# Patient Record
Sex: Male | Born: 1950 | Race: White | Hispanic: No | Marital: Single | Smoking: Former smoker
Health system: Southern US, Community
[De-identification: ages and names within clinical notes are randomized; demographics above are authoritative.]

## PROBLEM LIST (undated history)

## (undated) DIAGNOSIS — B20 Human immunodeficiency virus [HIV] disease: Secondary | ICD-10-CM

## (undated) DIAGNOSIS — I10 Essential (primary) hypertension: Secondary | ICD-10-CM

## (undated) DIAGNOSIS — Z21 Asymptomatic human immunodeficiency virus [HIV] infection status: Secondary | ICD-10-CM

---

## 1999-08-07 ENCOUNTER — Emergency Department (HOSPITAL_COMMUNITY): Admission: EM | Admit: 1999-08-07 | Discharge: 1999-08-07 | Payer: Self-pay | Admitting: Emergency Medicine

## 1999-08-08 ENCOUNTER — Encounter: Payer: Self-pay | Admitting: Emergency Medicine

## 1999-08-08 ENCOUNTER — Emergency Department (HOSPITAL_COMMUNITY): Admission: EM | Admit: 1999-08-08 | Discharge: 1999-08-08 | Payer: Self-pay | Admitting: Emergency Medicine

## 1999-08-14 ENCOUNTER — Ambulatory Visit (HOSPITAL_COMMUNITY): Admission: RE | Admit: 1999-08-14 | Discharge: 1999-08-14 | Payer: Self-pay | Admitting: Internal Medicine

## 1999-09-12 ENCOUNTER — Encounter: Admission: RE | Admit: 1999-09-12 | Discharge: 1999-09-12 | Payer: Self-pay | Admitting: Internal Medicine

## 2000-05-07 ENCOUNTER — Ambulatory Visit (HOSPITAL_COMMUNITY): Admission: RE | Admit: 2000-05-07 | Discharge: 2000-05-07 | Payer: Self-pay | Admitting: Internal Medicine

## 2002-08-01 ENCOUNTER — Emergency Department (HOSPITAL_COMMUNITY): Admission: EM | Admit: 2002-08-01 | Discharge: 2002-08-01 | Payer: Self-pay | Admitting: Emergency Medicine

## 2002-08-01 ENCOUNTER — Encounter: Payer: Self-pay | Admitting: Emergency Medicine

## 2002-08-25 ENCOUNTER — Inpatient Hospital Stay (HOSPITAL_COMMUNITY): Admission: EM | Admit: 2002-08-25 | Discharge: 2002-08-28 | Payer: Self-pay | Admitting: Emergency Medicine

## 2002-08-25 ENCOUNTER — Encounter: Payer: Self-pay | Admitting: Emergency Medicine

## 2002-09-03 ENCOUNTER — Encounter: Admission: RE | Admit: 2002-09-03 | Discharge: 2002-09-03 | Payer: Self-pay | Admitting: Internal Medicine

## 2002-09-03 ENCOUNTER — Ambulatory Visit (HOSPITAL_COMMUNITY): Admission: RE | Admit: 2002-09-03 | Discharge: 2002-09-03 | Payer: Self-pay | Admitting: Internal Medicine

## 2002-10-20 ENCOUNTER — Encounter: Admission: RE | Admit: 2002-10-20 | Discharge: 2002-10-20 | Payer: Self-pay | Admitting: Internal Medicine

## 2003-03-06 ENCOUNTER — Emergency Department (HOSPITAL_COMMUNITY): Admission: EM | Admit: 2003-03-06 | Discharge: 2003-03-06 | Payer: Self-pay | Admitting: Emergency Medicine

## 2003-03-06 ENCOUNTER — Encounter: Payer: Self-pay | Admitting: Emergency Medicine

## 2003-03-16 ENCOUNTER — Encounter: Admission: RE | Admit: 2003-03-16 | Discharge: 2003-03-16 | Payer: Self-pay | Admitting: Internal Medicine

## 2003-03-16 ENCOUNTER — Encounter: Payer: Self-pay | Admitting: Internal Medicine

## 2004-01-20 ENCOUNTER — Ambulatory Visit (HOSPITAL_COMMUNITY): Admission: RE | Admit: 2004-01-20 | Discharge: 2004-01-20 | Payer: Self-pay | Admitting: Internal Medicine

## 2004-01-20 ENCOUNTER — Encounter: Admission: RE | Admit: 2004-01-20 | Discharge: 2004-01-20 | Payer: Self-pay | Admitting: Internal Medicine

## 2004-08-15 ENCOUNTER — Emergency Department (HOSPITAL_COMMUNITY): Admission: EM | Admit: 2004-08-15 | Discharge: 2004-08-16 | Payer: Self-pay | Admitting: Emergency Medicine

## 2005-08-19 ENCOUNTER — Emergency Department (HOSPITAL_COMMUNITY): Admission: EM | Admit: 2005-08-19 | Discharge: 2005-08-19 | Payer: Self-pay | Admitting: Emergency Medicine

## 2006-01-03 ENCOUNTER — Emergency Department (HOSPITAL_COMMUNITY): Admission: EM | Admit: 2006-01-03 | Discharge: 2006-01-03 | Payer: Self-pay | Admitting: Family Medicine

## 2006-01-06 ENCOUNTER — Emergency Department (HOSPITAL_COMMUNITY): Admission: AD | Admit: 2006-01-06 | Discharge: 2006-01-06 | Payer: Self-pay | Admitting: Family Medicine

## 2007-01-18 ENCOUNTER — Emergency Department (HOSPITAL_COMMUNITY): Admission: EM | Admit: 2007-01-18 | Discharge: 2007-01-18 | Payer: Self-pay | Admitting: Family Medicine

## 2007-03-28 ENCOUNTER — Encounter: Admission: RE | Admit: 2007-03-28 | Discharge: 2007-03-28 | Payer: Self-pay | Admitting: General Surgery

## 2007-04-03 ENCOUNTER — Ambulatory Visit (HOSPITAL_BASED_OUTPATIENT_CLINIC_OR_DEPARTMENT_OTHER): Admission: RE | Admit: 2007-04-03 | Discharge: 2007-04-03 | Payer: Self-pay | Admitting: General Surgery

## 2007-04-03 ENCOUNTER — Encounter (INDEPENDENT_AMBULATORY_CARE_PROVIDER_SITE_OTHER): Payer: Self-pay | Admitting: General Surgery

## 2009-05-01 ENCOUNTER — Emergency Department (HOSPITAL_COMMUNITY): Admission: EM | Admit: 2009-05-01 | Discharge: 2009-05-02 | Payer: Self-pay | Admitting: Emergency Medicine

## 2009-05-13 ENCOUNTER — Emergency Department (HOSPITAL_COMMUNITY): Admission: EM | Admit: 2009-05-13 | Discharge: 2009-05-13 | Payer: Self-pay | Admitting: Family Medicine

## 2009-06-03 ENCOUNTER — Ambulatory Visit (HOSPITAL_COMMUNITY): Admission: RE | Admit: 2009-06-03 | Discharge: 2009-06-03 | Payer: Self-pay | Admitting: Internal Medicine

## 2009-06-03 ENCOUNTER — Ambulatory Visit: Payer: Self-pay | Admitting: Internal Medicine

## 2009-06-03 DIAGNOSIS — F17200 Nicotine dependence, unspecified, uncomplicated: Secondary | ICD-10-CM

## 2009-06-03 DIAGNOSIS — I10 Essential (primary) hypertension: Secondary | ICD-10-CM

## 2009-06-03 DIAGNOSIS — Z87898 Personal history of other specified conditions: Secondary | ICD-10-CM

## 2009-06-03 DIAGNOSIS — Z9889 Other specified postprocedural states: Secondary | ICD-10-CM

## 2009-06-03 DIAGNOSIS — B20 Human immunodeficiency virus [HIV] disease: Secondary | ICD-10-CM

## 2009-06-03 DIAGNOSIS — Z8709 Personal history of other diseases of the respiratory system: Secondary | ICD-10-CM | POA: Insufficient documentation

## 2009-06-03 DIAGNOSIS — K029 Dental caries, unspecified: Secondary | ICD-10-CM | POA: Insufficient documentation

## 2009-06-03 DIAGNOSIS — J984 Other disorders of lung: Secondary | ICD-10-CM | POA: Insufficient documentation

## 2009-06-03 DIAGNOSIS — IMO0002 Reserved for concepts with insufficient information to code with codable children: Secondary | ICD-10-CM | POA: Insufficient documentation

## 2009-06-03 LAB — CONVERTED CEMR LAB
AST: 19 units/L (ref 0–37)
Albumin: 4 g/dL (ref 3.5–5.2)
Alkaline Phosphatase: 72 units/L (ref 39–117)
BUN: 15 mg/dL (ref 6–23)
Calcium: 9.5 mg/dL (ref 8.4–10.5)
Chloride: 104 meq/L (ref 96–112)
Cholesterol: 119 mg/dL (ref 0–200)
Creatinine, Ser: 0.86 mg/dL (ref 0.40–1.50)
GC Probe Amp, Urine: NEGATIVE
Glucose, Bld: 67 mg/dL — ABNORMAL LOW (ref 70–99)
HCV Ab: NEGATIVE
HIV 1 RNA Quant: 23200 copies/mL — ABNORMAL HIGH (ref <48)
HIV-1 RNA Quant, Log: 4.37 — ABNORMAL HIGH (ref <1.68)
Hep B S Ab: NEGATIVE
Triglycerides: 53 mg/dL (ref <150)
VLDL: 11 mg/dL (ref 0–40)

## 2009-06-07 ENCOUNTER — Telehealth (INDEPENDENT_AMBULATORY_CARE_PROVIDER_SITE_OTHER): Payer: Self-pay | Admitting: *Deleted

## 2009-07-08 ENCOUNTER — Ambulatory Visit: Payer: Self-pay | Admitting: Internal Medicine

## 2009-07-08 ENCOUNTER — Telehealth: Payer: Self-pay | Admitting: Internal Medicine

## 2009-07-08 DIAGNOSIS — H9319 Tinnitus, unspecified ear: Secondary | ICD-10-CM | POA: Insufficient documentation

## 2009-07-08 LAB — CONVERTED CEMR LAB

## 2009-10-25 ENCOUNTER — Ambulatory Visit: Payer: Self-pay | Admitting: Internal Medicine

## 2009-10-25 ENCOUNTER — Encounter (INDEPENDENT_AMBULATORY_CARE_PROVIDER_SITE_OTHER): Payer: Self-pay | Admitting: *Deleted

## 2009-11-10 ENCOUNTER — Telehealth: Payer: Self-pay | Admitting: Internal Medicine

## 2009-11-18 ENCOUNTER — Encounter (INDEPENDENT_AMBULATORY_CARE_PROVIDER_SITE_OTHER): Payer: Self-pay | Admitting: *Deleted

## 2009-11-18 ENCOUNTER — Emergency Department (HOSPITAL_COMMUNITY): Admission: EM | Admit: 2009-11-18 | Discharge: 2009-11-18 | Payer: Self-pay | Admitting: Family Medicine

## 2009-11-18 ENCOUNTER — Emergency Department (HOSPITAL_COMMUNITY): Admission: EM | Admit: 2009-11-18 | Discharge: 2009-11-19 | Payer: Self-pay | Admitting: Emergency Medicine

## 2009-11-19 ENCOUNTER — Encounter: Payer: Self-pay | Admitting: Internal Medicine

## 2009-11-19 ENCOUNTER — Encounter (INDEPENDENT_AMBULATORY_CARE_PROVIDER_SITE_OTHER): Payer: Self-pay | Admitting: Internal Medicine

## 2009-11-19 ENCOUNTER — Ambulatory Visit: Payer: Self-pay | Admitting: Internal Medicine

## 2009-11-19 ENCOUNTER — Inpatient Hospital Stay (HOSPITAL_COMMUNITY): Admission: EM | Admit: 2009-11-19 | Discharge: 2009-11-23 | Payer: Self-pay | Admitting: Emergency Medicine

## 2009-11-22 ENCOUNTER — Encounter: Payer: Self-pay | Admitting: Internal Medicine

## 2009-11-29 ENCOUNTER — Ambulatory Visit: Payer: Self-pay | Admitting: Internal Medicine

## 2009-12-23 ENCOUNTER — Ambulatory Visit: Payer: Self-pay | Admitting: Internal Medicine

## 2009-12-23 LAB — CONVERTED CEMR LAB
HDL: 44 mg/dL (ref >39)
LDL Cholesterol: 55 mg/dL (ref 0–99)
Total CHOL/HDL Ratio: 2.5

## 2010-02-21 ENCOUNTER — Telehealth: Payer: Self-pay | Admitting: Internal Medicine

## 2010-12-07 ENCOUNTER — Encounter (HOSPITAL_COMMUNITY): Payer: Self-pay | Admitting: Radiology

## 2010-12-07 ENCOUNTER — Inpatient Hospital Stay (HOSPITAL_COMMUNITY)
Admission: EM | Admit: 2010-12-07 | Discharge: 2010-12-11 | DRG: 190 | Disposition: A | Discharge status: Home / Self Care | Payer: 59 | Attending: Internal Medicine | Admitting: Internal Medicine

## 2010-12-07 ENCOUNTER — Emergency Department (HOSPITAL_COMMUNITY): Payer: 59

## 2010-12-07 ENCOUNTER — Encounter: Payer: Self-pay | Admitting: Internal Medicine

## 2010-12-07 DIAGNOSIS — J441 Chronic obstructive pulmonary disease with (acute) exacerbation: Principal | ICD-10-CM | POA: Diagnosis present

## 2010-12-07 DIAGNOSIS — I10 Essential (primary) hypertension: Secondary | ICD-10-CM | POA: Diagnosis present

## 2010-12-07 DIAGNOSIS — B20 Human immunodeficiency virus [HIV] disease: Secondary | ICD-10-CM | POA: Diagnosis present

## 2010-12-07 DIAGNOSIS — R509 Fever, unspecified: Secondary | ICD-10-CM

## 2010-12-07 DIAGNOSIS — M7989 Other specified soft tissue disorders: Secondary | ICD-10-CM

## 2010-12-07 DIAGNOSIS — R3129 Other microscopic hematuria: Secondary | ICD-10-CM | POA: Diagnosis present

## 2010-12-07 DIAGNOSIS — F17200 Nicotine dependence, unspecified, uncomplicated: Secondary | ICD-10-CM | POA: Diagnosis present

## 2010-12-07 HISTORY — DX: Essential (primary) hypertension: I10

## 2010-12-07 LAB — DIFFERENTIAL
Basophils Absolute: 0 10*3/uL (ref 0.0–0.1)
Eosinophils Absolute: 0 10*3/uL (ref 0.0–0.7)
Eosinophils Relative: 0 % (ref 0–5)
Neutrophils Relative %: 80 % — ABNORMAL HIGH (ref 43–77)

## 2010-12-07 LAB — HEPATIC FUNCTION PANEL
ALT: 13 U/L (ref 0–53)
Alkaline Phosphatase: 60 U/L (ref 39–117)
Bilirubin, Direct: 0.3 mg/dL (ref 0.0–0.3)
Indirect Bilirubin: 0.9 mg/dL (ref 0.3–0.9)
Total Bilirubin: 1.2 mg/dL (ref 0.3–1.2)

## 2010-12-07 LAB — URINALYSIS, ROUTINE W REFLEX MICROSCOPIC
Nitrite: NEGATIVE
Protein, ur: 30 mg/dL — AB
Urobilinogen, UA: 1 mg/dL (ref 0.0–1.0)

## 2010-12-07 LAB — LIPASE, BLOOD: Lipase: 25 U/L (ref 11–59)

## 2010-12-07 LAB — CBC
HCT: 41.1 % (ref 39.0–52.0)
Hemoglobin: 14.4 g/dL (ref 13.0–17.0)
MCH: 29.1 pg (ref 26.0–34.0)
MCHC: 35 g/dL (ref 30.0–36.0)
Platelets: 102 10*3/uL — ABNORMAL LOW (ref 150–400)

## 2010-12-07 LAB — PROTIME-INR: Prothrombin Time: 15.7 seconds — ABNORMAL HIGH (ref 11.6–15.2)

## 2010-12-07 LAB — BASIC METABOLIC PANEL
BUN: 19 mg/dL (ref 6–23)
Calcium: 8.5 mg/dL (ref 8.4–10.5)
GFR calc non Af Amer: >60 mL/min (ref >60)
Glucose, Bld: 88 mg/dL (ref 70–99)

## 2010-12-07 LAB — URINE MICROSCOPIC-ADD ON

## 2010-12-07 MED ORDER — IOHEXOL 300 MG/ML  SOLN: 100.0000 mL | Freq: Once | INTRAMUSCULAR | Status: AC | PRN

## 2010-12-07 NOTE — Miscellaneous (Signed)
Summary: RW Update  Clinical Lists Changes  Observations: Added new observation of HIV RISK BEH: Heterosexual contact (11/18/2009 15:14)

## 2010-12-07 NOTE — Progress Notes (Signed)
  Phone Note Other Incoming   Summary of Call: Noah Perez is taking his Atripla but cannot sleep consistently or well.  His viral load is undetectable.  I will continue his current regimen and give him Ambien to use carefully as needed. Initial call taken by: Cliffton Asters MD,  February 21, 2010 10:54 AM    New/Updated Medications: AMBIEN 5 MG TABS (ZOLPIDEM TARTRATE) Take 1 tablet by mouth at bedtime as needed for sleep Prescriptions: AMBIEN 5 MG TABS (ZOLPIDEM TARTRATE) Take 1 tablet by mouth at bedtime as needed for sleep  #30 x 1   Entered and Authorized by:   Cliffton Asters MD   Signed by:   Cliffton Asters MD on 02/21/2010   Method used:   Print then Give to Patient   RxID:   [id number]

## 2010-12-07 NOTE — Miscellaneous (Signed)
Summary: hospital admission (cough)  INTERNAL MEDICINE ADMISSION HISTORY AND PHYSICAL  PCP: Dr. Cliffton Asters  ATTENDING 1st contact: Dr. Scot Dock: [phone] 2nd contact: Dr. Threasa Beards: [phone] Nights and weekends: 1st: [phone], 2nd: [phone]  CC: Cough, SOB and myalgia  HPI: [name] is a 60 yo M with PMH with a 2 week h/o shortness of breath and cough. He reports that the SOB is persistent through out the day, feels like his chest is congested,  has been stable since its onset, occurs with exertion as well as at rest. Worsened by continued talking.  He also has mild episodic cough which was initially productive of yellowish sputum but for the past 3-4 days has been dry. He denies any fevers but reports having had a few nights where he wakes up "soaking wet with sweat." He is pain bilaterlaly on his knees which is new and has been going on since 2 weeks as well. He has rashes on both knees and elbows but he say he has psoriasis and had the rashes before as well. He also has occasional headaches in last 2 weeks. He denies nausea,voming, chest pain, fatigue, sick contacts or recent travel. Other than these symptoms, he says he "feels fine."  ALLERGIES: PERCOCET   PAST MEDICAL HISTORY:  1. HIV disease: - Dignosed in 1995. Was taking an experimental drug for 2 years after that, does not remember the name. Says it did not help.  - Last CD4 count: 280 Viral load:23200 ( July 2010) - Genotype: No resistance to any antiretroviral  - medication- Atripla  started in 10/25/2009.   -He denied antiretrovirals at that time because of lack on insurance.  2.  CAP in 2003 when his CD4 count was 150. He rebounded back to 300 without any antiretroviral treatment. 3. Psoriasis of knees:  4. Singles in 05/2009 5. Tobacco abuse and stopped 2 weeks ago.    MEDICATIONS:   ATRIPLA 600-200-300 MG TABS (EFAVIRENZ-EMTRICITAB-TENOFOVIR) Take 1 tablet by mouth once a day. Compliant with his medication.    NAPROSYN 500 MG TABS (NAPROXEN) Take 1 tablet by mouth two times a day as needed for pain    SOCIAL HISTORY: Lives with wife and child. Works as an Tourist information centre manager. Quit smoking 2 weeks back. Used to smoke 1 ppd before that. Drinks hard liquor 1-2/month. Never used illicit drugs. Has blue cross/blue shield insurance through his employer.   FAMILY HISTORY Father dies from alcoholic cirhosis. Has psoriasis as well Mother is healthy without any know medical problems  VITALS: T: 98 P: 73  BP:134/82  R: 22 O2SAT: 98% ON: RA  PHYSICAL EXAM: Gen: Awake,. NAD, brathing comfortably on RA. Occasional cough HEENT: PERRLA, EOMI, oral thrush, oropharynx non congested. No LN enlargement CVS: S1 S2 normal, No M/R/G Respi: b/l basilar creptiations, no wheezes or crackles. good air entry b/l.  Abd: s/NT/ND. +BS Neuro: oreinted X3., Grossly non focal Extremity: No edema. ++ pulasations b/l  LABS:  Sodium (NA)                              138               135-145          mEq/L    DELTA CHECK NOTED  Potassium (K)                            3.9  3.5-5.1          mEq/L  Chloride                                 106               96-112           mEq/L    DELTA CHECK NOTED  CO2                                      25                19-32            mEq/L  Glucose                                  68         l      70-99            mg/dL  BUN                                      16                6-23             mg/dL  Creatinine                               0.84              0.4-1.5          mg/dL  Bilirubin, Total                         0.3               0.3-1.2          mg/dL  Alkaline Phosphatase                     80                39-117           U/L  SGOT (AST)                               28                0-37             U/L  SGPT (ALT)                               24                0-53             U/L  Total  Protein                           8.3  6.0-8.3           g/dL  Albumin-Blood                            3.1        l      3.5-5.2          g/dL  Calcium                                  8.7               8.4-10.5         mg/dL   WBC                                      10.0              4.0-10.5         K/uL  RBC                                      4.70              4.22-5.81        MIL/uL  Hemoglobin (HGB)                         14.4              13.0-17.0        g/dL  Hematocrit (HCT)                         41.0              39.0-52.0        %  MCV                                      87.3              78.0-100.0       fL  MCHC                                     35.1              30.0-36.0        g/dL  RDW                                      12.8              11.5-15.5        %  Platelet Count (PLT)                     245               150-400          K/uL  Neutrophils, %  72                43-77            %  Lymphocytes, %                           14                12-46            %  Monocytes, %                             11                3-12             %  Eosinophils, %                           1                 0-5              %  Basophils, %                             1                 0-1              %  Neutrophils, Absolute                    7.2               1.7-7.7          K/uL  Lymphocytes, Absolute                    1.4               0.7-4.0          K/uL  Monocytes, Absolute                      1.1        h      0.1-1.0          K/uL  Eosinophils, Absolute                    0.1               0.0-0.7          K/uL  Basophils, Absolute                      0.1               0.0-0.1          K/uL  LDH                                      173               94-250           U/L   D-Dimer, Fibrin Derivatives              0.48  0.00-0.48        ug/mL-FEU  CXR:  Patchy bilateral airspace densities are noted which are suspicious for multifocal infection.  ASSESSMENT AND  PLAN:   1. Cough+ SOB : Given CXR findings and atypical symptoms, H elikely has PCP pneumonia. Other possibilities are viral or bacterial CAP, pulmonary TB,  PE ( very unlikely given no history and negative DDimer), alleric reaction which is very unlikely given new CXR findings.  Is currently not in any acute distress mandating any repairatory assistance.   Will get an ABG, sputum gm stain and culture as well as PCP and AFB stain. Will also order blood Cx.   Will start treatment for PCP with bactrim DS and for CAP with rocephin and zithromax. If ABG shows PO2<70, will add prednisone as well  Will monitor O2 saturation. Will not get a repat CXR if clinically improving with the antibiotics. Will considering changing to oral regimen after 24-48 hrs of iv antibiotics if symptoms improves.  2. HIV: Will get CD4 and viral load as last one was in July. Will continue Atripla at home dose.  3. Thrush: Will treat with fluconazole for now 4. Dispositon: Will monitor clinical responce to the antibiotic and if doing good with no significant abnormality on ABG, will consider early discharge on oral bactrim for 21 days.   5. VTE PROPH: lovenox

## 2010-12-07 NOTE — Assessment & Plan Note (Signed)
Summary: fukam   CC:  follow-up visit.  History of Present Illness: Noah Perez is in for his hospital follow-up visit.  He was hospitalized about 10 days ago with community-acquired pneumonia.  He had nonspecific, mild bilateral infiltrates.  No lung nodule was seen as noted when he had a CT scan in [place], [place] last year. His blood cultures were negative and he was unable to produce any sputum for testing.  He improved on empiric Rocephin and Zithromax and was transitioned to oral Avelox before discharge.  He has 3 days of Avelox left.  He is feeling much better with resolution of his fever, headache, and shortness of breath.  He still has very mild but improving nonproductive cough.  He has not missed a single dose of his a Atripla  since starting it in December.  Preventive Screening-Counseling & Management  Alcohol-Tobacco     Alcohol drinks/day: 0     Smoking Status: quit < 6 months     Year Quit: 11/05/2009  Caffeine-Diet-Exercise     Caffeine use/day: yes     Does Patient Exercise: no  Hep-HIV-STD-Contraception     Hepatitis Risk: no risk noted  Safety-Violence-Falls     Seat Belt Use: no  Comments: declined condom      Sexual History:  currently monogamous.        Drug Use:  never.     Current Allergies: ! PERCOCET Social History: Sexual History:  currently monogamous Drug Use:  never  Vital Signs:  Patient profile:   60 year old male Height:      71 inches (180.34 cm) Weight:      141.0 pounds (64.09 kg) BMI:     19.74 Temp:     94.5 degrees F (34.72 degrees C) Resp:     59 per minute BP sitting:   158 / 94  (left arm)  Vitals Entered By: Jennet Maduro RN (November 29, 2009 9:44 AM) CC: follow-up visit Is Patient Diabetic? No Pain Assessment Patient in pain? no      Nutritional Status BMI of 19 -24 = normal Nutritional Status Detail appetite "better than last week"  Have you ever been in a relationship where you felt threatened, hurt or  afraid?No   Does patient need assistance? Functional Status Self care Ambulation Normal Comments no missed dosage   Physical Exam  General:  alert and well-nourished, but thin.  but he looks much better than when he was in the hospital last week. Mouth:  pharynx pink and moist, no erythema, no exudates, and edentulous.   Lungs:  normal respiratory effort, normal breath sounds, no crackles, and no wheezes.   Heart:  normal rate, regular rhythm, and no murmur.   Psych:  normally interactive, good eye contact, not anxious appearing, and not depressed appearing.          Medication Adherence: 11/29/2009   Adherence to medications reviewed with patient. Counseling to provide adequate adherence provided                                Impression & Recommendations:  Problem # 1:  PNEUMONIA, HX OF (ICD-V12.60) His community-acquired pneumonia is resolving nicely.  I will have him complete 3 more days of Avelox.  Problem # 2:  HIV DISEASE (ICD-042) He is tolerating his antiretroviral therapy well and his adherence is excellent.  I will repeat CD4 and viral load in 3 weeks. His updated  medication list for this problem includes:    Avelox 400 Mg Tabs (Moxifloxacin hcl) ..... 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  This should not require any further follow-up.  Medications Added to Medication List This Visit: 1)  Avelox 400 Mg Tabs (Moxifloxacin hcl) .... Once every day  Other Orders: Est. Patient Level IV (04540) Future Orders: T-CD4SP ([facility]) (CD4SP) ... 12/20/2009 T-HIV Viral Load [phone]) ... 12/20/2009 T-RPR (Syphilis) [id number]) ... 12/20/2009 T-Lipid Profile [phone]) ... 12/20/2009  Patient Instructions: 1)   Please schedule a follow-up appointment in 1 month.  Process Orders Check Orders Results:     [facility]: ABN not required for this insurance Tests Sent for requisitioning (November 29, 2009 10:06 AM):     12/20/2009: [facility] -- T-HIV Viral Load [phone] (signed)     12/20/2009: [facility] -- T-RPR (Syphilis) [phone] (signed)     12/20/2009: [facility] -- T-Lipid Profile [phone] (signed)

## 2010-12-07 NOTE — Miscellaneous (Signed)
Summary: hospital discharge ( CAP)   Hospital Discharge  Date of admission: 11/19/2009  Date of discharge: 11/23/2009  Brief reason for admission/active problems: Community acquired pneumonia  Followup needed: Patient is discharged on avelox, 10 day course for CAP. Will need to follow clinical progression and response to treatment on outpatient bases. Had some dry cough, mild SOB with exertion and night sweats at the time of dishcarge. He is afebrile and normal WBC.   Has also been prescribed diflucan for oral thrush. Please follow up on resolution of thrush in [facility]  The medication and problem lists have been updated.  Please see the dictated discharge summary for details.   Prescriptions: VICODIN 5-500 MG TABS (HYDROCODONE-ACETAMINOPHEN) Every 8 hrs as needed for pain  #10 x 0   Entered and Authorized by:   Bethel Born MD   Signed by:   Bethel Born MD on 11/23/2009   Method used:   Print then Give to Patient   RxID:   [id number] AVELOX 400 MG TABS (MOXIFLOXACIN HCL) once every day  #10 x 0   Entered and Authorized by:   Bethel Born MD   Signed by:   Bethel Born MD on 11/23/2009   Method used:   Print then Give to Patient   RxID:   [id number]    Patient Instructions: 1)  - Your next appointment with Dr. Orvan Falconer is on Feb 1st, 2011 at 11:30.  2)  - Use petroleum jelly for the dry areas on your elbows and knees.  3)  - Congratulations for quitting smoking. Keep it up.  4)  - Take your medicines regulalrly and make it to the appointments with the clinic 5)  - Call us if you have any other medical problems or if you are not filling well.

## 2010-12-07 NOTE — Progress Notes (Signed)
Summary: requesting prescription pain med for headache  Phone Note Call from Patient Call back at Home Phone [phone]   Caller: Significant Other Reason for Call: Acute Illness Summary of Call: Headache x 3 days.  Has tried multiple OTC medications.  Requesting prescription medication.  Please advise.  Jennet Maduro RN  November 10, 2009 2:27 PM   Follow-up for Phone Call        Please call Noah Perez back and if he is still having unrelieved HA then schedule him to see me next Tuesday morning, 1/18, or sooner if needed with the clinic doctor. Follow-up by: Cliffton Asters MD,  November 15, 2009 4:39 PM

## 2010-12-07 NOTE — Initial Assessments (Signed)
Summary: H&P  INTERNAL MEDICINE TEACHING SERVICE ADMISSION HISTORY & PHYSICAL   Attending: Dr. Cliffton Asters First contact: Dr. Arvilla Market [phone] Dr. Denton Meek [phone] Second contact: Dr. Polly Cobia [phone] [facility], after-hours: [phone], [phone])    PCP: Dr. Orvan Falconer   CC: Cough, SOB   HPI: Mr. Champney is a 60 yo M with PMH significant for HIV (last CD4 = 280 in 7/10) recently started on Atripla 3 weeks ago who presents with a 2 week h/o cough. He says the cough was initially productive of yellowish sputum but for the past 3-4 days has been dry. He also reports occasionally feeling SOB with the sensation that he can't quite catch his breath. This is not precipitated by anything, as it can occur whether he is exerting himself or simply sitting there, but generally speaking he can still get around and work without problems. He denies any fevers but reports having had a few nights where he wakes up "soaking wet with sweat." Other than these symptoms, he says he "feels fine."   He says he tried to call the clinic twice this past week about his concerns but his calls were not returned. He is very angry about this.  Allergies: ! PERCOCET   Past Medical History:  HIV disease: cd4 of 280 and viral load 23200, July 2010 Hypertension Pneumonia, hx of  Past Surgical History:    Current Meds:  ATRIPLA 600-200-300 MG TABS (EFAVIRENZ-EMTRICITAB-TENOFOVIR) Take 1 tablet by mouth once a day NAPROSYN 500 MG TABS (NAPROXEN) Take 1 tablet by mouth two times a day as needed for pain    Social History:   Lives with his wife and child. Works as an Tourist information centre manager.  Family History:    Review of System: Negative except per HPI.    Vital Signs: BP-127/74 HR-78 RR-16 Temp-97.7 Sat-99%/ra  Physical Exam: GEN- Not in distress, angry HEENT- NCAT, PERRL, no icterus/pallor LUNGS- mild crackles L base CVS- RRR, no murmur ABD- soft, non-tender, normal bowel sounds.   EXT- no cyanosis, clubbing or  edema.  NEURO- no focal deficit. PSY- appropriate.    LAB RESULTS            13.7 11.6 >--------<248         ANC: 9                MCV:87.3   128        96       24 -------------------------< 1.6     4.2         26        1    Calcium:  8.3     Anion gap: 6        Bilirubin: 0.4         Alk phos:  80      AST: 28        ALT: 25         Protein: 7.8       Albumin: 3.3      CK 179  CXR: Patchy bilateral airspace disease  ASSESSMENT & PLAN  This is a 60 yo M with PMH of HIV with CD4 = 280 who presents with cough, SOB, and night sweats x 2 weeks. The most likely diagnosis is CAP, though the CXR is not necessarily consistent with the typical lobar consolidation CAP. The plan was to admit this patient and start him on antibiotic treatment for possible CAP while getting further HIV labs (CD4 count, viral  load) to determine if this could be an opportunistic infection. However, the patient refused to be admitted tonight, saying that he needed to "get some loose ends tied up" before he would accept being admitted. He initially said he was willing to come back around noon today, then he requested to see Dr. Orvan Falconer in the hospital without being admitted. He became very angry when he was told he would likely have to be admitted to see Dr. Orvan Falconer this weekend and threatened to not get treated at all and "suffer with this at home." I said I would do my best to have Dr. Orvan Falconer call him in the morning, and his wife expressed hope that he would come back later in the day. He understands that we, as medical professionals, would recommend he be admitted to the hospital and the risks of not being treated for his lung infection. He still insisted on going home and signed AMA.  ATTENDING PHYSICIAN: I performed and/or observed a history and physical examination of the patient.  I discussed the case with the residents as noted and reviewed the residents' notes.  I agree with the findings and plan--please refer  to the attending physician note for more details.  Signature________________________________  Printed Name_____________________________

## 2010-12-08 ENCOUNTER — Inpatient Hospital Stay (HOSPITAL_COMMUNITY): Payer: 59

## 2010-12-08 DIAGNOSIS — R509 Fever, unspecified: Secondary | ICD-10-CM

## 2010-12-08 LAB — URINE MICROSCOPIC-ADD ON

## 2010-12-08 LAB — T-HELPER CELLS (CD4) COUNT (NOT AT ARMC): CD4 T Cell Abs: 280 uL — ABNORMAL LOW (ref 400–2700)

## 2010-12-08 LAB — URINALYSIS, ROUTINE W REFLEX MICROSCOPIC
Ketones, ur: 15 mg/dL — AB
Urobilinogen, UA: 1 mg/dL (ref 0.0–1.0)

## 2010-12-08 LAB — BASIC METABOLIC PANEL
CO2: 26 mEq/L (ref 19–32)
Calcium: 8.6 mg/dL (ref 8.4–10.5)
Creatinine, Ser: 1.02 mg/dL (ref 0.4–1.5)
GFR calc non Af Amer: >60 mL/min (ref >60)
Glucose, Bld: 105 mg/dL — ABNORMAL HIGH (ref 70–99)
Sodium: 138 mEq/L (ref 135–145)

## 2010-12-08 LAB — EXPECTORATED SPUTUM ASSESSMENT W GRAM STAIN, RFLX TO RESP C

## 2010-12-08 LAB — CBC: WBC: 7 10*3/uL (ref 4.0–10.5)

## 2010-12-09 ENCOUNTER — Inpatient Hospital Stay (HOSPITAL_COMMUNITY): Payer: 59

## 2010-12-09 LAB — BASIC METABOLIC PANEL
BUN: 14 mg/dL (ref 6–23)
Chloride: 105 mEq/L (ref 96–112)
Creatinine, Ser: 1.05 mg/dL (ref 0.4–1.5)

## 2010-12-09 MED ORDER — IOHEXOL 240 MG/ML SOLN
100.0000 mL | Freq: Once | INTRAMUSCULAR | Status: AC | PRN
  Administered 2010-12-09: 100 mL via INTRAVENOUS

## 2010-12-11 LAB — CULTURE, RESPIRATORY W GRAM STAIN

## 2010-12-13 NOTE — Assessment & Plan Note (Signed)
Summary: Hospital Admission  INTERNAL MEDICINE ADMISSION HISTORY AND PHYSICAL ***Place in progress notes section of chart***  Attending: Dr. Josem Kaufmann 1st conact: Dr. Odis Luster [phone] 2nd contact: Dr. Gilford Rile [phone] Weekends, [place], After 5pm Weekdays: 1st contact: [phone] 2nd contact: [phone]    PCP: Dr. Orvan Falconer  CC: SOB  HPI:  Patient is a 60 year old man with a PMH significant for HIV, HTN, and history of admissions for pneumonia who presented to the [facility] ED because of a 2 day history of shortness of breath and cough.  He states that the SOB gets worse with increased activity.  He has had some chills and fatigue but denies fevers, nausea, vomitting, chest pain, abdominal pain, constipation, diarrhea, or sick contacts.  He does smoke 1/2 to 3/4 of a ppd of cigarettes and has smoked for 40 years.  He previously smoked more, up to 2 ppd.    He has a history of HIV diagnosised in 1995 with his last CD4 200 in February 2011 and Viral load of undetected.  He has not been taking his Atripla since August 2011 because he hasn't followed up with Dr. Orvan Falconer in his clinic for refills.  He has also not been taking any medications for prophylaxis.  ALLERGIES:  PERCOCET: nausea/vomiting  PAST MEDICAL HISTORY: HIV disease     - last CD4 200, VL  undetectable Hypertension Pneumonia, hx of   MEDICATIONS: ATRIPLA 600-200-300 MG TABS (EFAVIRENZ-EMTRICITAB-TENOFOVIR) Take 1 tablet by mouth once a day.  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  Lives in [place].  He is an active smoker formerly 2-3 ppd but currently down to 1/2 to 3/4 of a ppd.  FAMILY HISTORY:  Non-contributory  ROS: as per HPI, all other systems reviewed and negative   VITALS: T: 98  P: 80  BP: 159/98  R:  18  O2SAT:94%  ON: RA  PHYSICAL EXAM: General:  alert, well-developed, NAD, cooperative, A&Ox3 Head:  normocephalic and atraumatic.   Eyes:  PERRLA, EOMI, vision grossly intact, conjuctive and sclerae within normal limits.   Mouth:  MMM, no erythema, no exudates, or lesions.   Neck:  supple, full ROM, trachea midline, no palp masses, no JVD, no carotid bruits.   Lungs:  normal respiratory effort. mild fine crackles with deep inspiration in the bases, expiratory wheeze in bases bilaterally. Heart: RRR, no M/R/G Abdomen:  soft, NT, ND, BS present and normoactive, no palpable masses  Msk:  no joint swelling, warmth, or erythema  Neurologic:  CN II-XII intact,+5 strength globally, sensation grossly intact, gait normal.   Skin:  turgor normal and scaley plaques on elbows bilaterally as well as knees. Psych: memory intact for recent and remote, normally interactive, good eye contact, affect as expected   LABS:   Sodium (NA)                              134        l      135-145          mEq/L  Potassium (K)  3.5               3.5-5.1          mEq/L  Chloride                                 103               96-112           mEq/L  CO2                                      21                19-32            mEq/L  Glucose                                  88                70-99            mg/dL  BUN                                      19                6-23             mg/dL  Creatinine                               1.02              0.4-1.5          mg/dL  GFR, Est Non African American            >60               >60              mL/min  GFR, Est African American                >60               >60              mL/min    Oversized comment, see footnote  1  Calcium                                  8.5               8.4-10.5         mg/dL  Bilirubin, Total                         1.2               0.3-1.2          mg/dL  Bilirubin, Direct                        0.3  0.0-0.3          mg/dL  Indirect Bilirubin                       0.9               0.3-0.9          mg/dL  Alkaline Phosphatase                     60                39-117           U/L  SGOT (AST)                               23                0-37             U/L  SGPT (ALT)                               13                0-53             U/L  Total  Protein                           7.5               6.0-8.3          g/dL  Albumin-Blood                            3.5               3.5-5.2          g/dL   WBC                                      9.5               4.0-10.5         K/uL  RBC                                      4.94              4.22-5.81        MIL/uL  Hemoglobin (HGB)                         14.4              13.0-17.0        g/dL  Hematocrit (HCT)                         41.1              39.0-52.0        %  MCV  83.2              78.0-100.0       fL  MCH -                                    29.1              26.0-34.0        pg  MCHC                                     35.0              30.0-36.0        g/dL  RDW                                      13.1              11.5-15.5        %  Platelet Count (PLT)                     102        l      150-400          K/uL  Neutrophils, %                           80         h      43-77            %  Lymphocytes, %                           12                12-46            %  Monocytes, %                             8                 3-12             %  Eosinophils, %                           0                 0-5              %  Basophils, %                             0                 0-1              %  Neutrophils, Absolute                    7.6               1.7-7.7  K/uL  Lymphocytes, Absolute                    1.1               0.7-4.0          K/uL  Monocytes, Absolute                      0.7               0.1-1.0          K/uL  Eosinophils, Absolute                     0.0               0.0-0.7          K/uL  Basophils, Absolute                      0.0               0.0-0.1          K/uL   Protime ( Prothrombin Time)              15.7       h      11.6-15.2        seconds  INR                                      1.23              0.00-1.49   Lipase                                   25                11-59            U/L   Color, Urine                             AMBER      a      YELLOW  Appearance                               CLEAR             CLEAR  Specific Gravity                         1.026             1.005-1.030  pH                                       6.0               5.0-8.0  Urine Glucose                            NEGATIVE          NEG  mg/dL  Bilirubin                                NEGATIVE          NEG  Ketones                                  40         a      NEG              mg/dL  Blood                                    LARGE      a      NEG  Protein                                  30         a      NEG              mg/dL  Urobilinogen                             1.0               0.0-1.0          mg/dL  Nitrite                                  NEGATIVE          NEG  Leukocytes                               NEGATIVE          NEG  Squamous Epithelial / LPF                RARE              RARE  WBC / HPF                                0-2               <3               WBC/hpf  RBC / HPF                                7-10              <3               RBC/hpf  CXR:   Patchy left lower lung interstitial infiltrate.  CTA Chest:     1.  Negative for acute PE or thoracic aortic dissection.   2.  Coronary and aortic calcifications.   3.  Patchy bibasilar airspace opacities suggesting early pneumonia.  ASSESSMENT AND PLAN: 1. SOB and cough:  With SOB and LL infiltrate on CXR CAP seems  most likely, however pt has been off ART with unknown CD4 count/VL; will therefore treat empirically for CAP and PCP.   Other causes of his SOB could include undiagnosed COPD vs. PE.  CT angio showed empysema changes, and possible early PNA but no PE.  He is not on any medications so this could be a worsening of COPD to the point where he will need treatment in the future    - Admit to regular bed   - Avelox 400mg  IV qday   - Bactrim DS 2 tabs by mouth q8h   - Atrovent and albuterol Nebs   - Smoking cessation counseling   - LDH, sputum for gram stain, culture, and PCR for PCP.  2. HIV:  He has been off treatment since August of 2011 because he didn't follow up in clinic to get refills.  He reports that when he had the medicine he took it faithfully but just ran out of refills.      - WIll check CD4 and VL   - Will ensure pt has f/u at [facility] on d/c home  3. Tobacco use Pt declines nicotine patch.    - Will obtain smoking cessation consult  4. Hematuria Pt with large blood and 7-10 RBCs on U/A.  Given his age and smoking hx, this is slightly concerning for underlying malignancy however the presence of protein may also indicate a glomerular process (i.e. HIV, post-strep glomerular nephritis, etc).  Pt is without symptoms or UTI or nephrolithiasis.  RPR neg as of 2011, HCV/HBV Abs neg in 2010.    - Repeat U/A in am   - Will check spot urine protein   - Consider further w/u (urine cytology, SPEP/UPEP, ANA, imaging, etc) if hematuria persists  5. VTE PROPH: lovenox  ATTENDING: I performed and/or observed a history and physical examination of the patient.  I discussed the case with the residents as noted and reviewed the residents' notes.  I agree with the findings and plan--please refer to the attending physician note for more details.  Signature________________________________  Printed Name_____________________________

## 2010-12-15 LAB — HIV-1 GENOTYPR PLUS: Resistance Assoc RT Mutations: NOT DETECTED

## 2010-12-22 NOTE — Discharge Summary (Signed)
Noah Perez, Noah Perez          ACCOUNT NO.:  [id number]  MEDICAL RECORD NO.:  [id number]           PATIENT TYPE:  I  LOCATION:  5509                         FACILITY:  [facility]  PHYSICIAN:  Doneen Poisson, MD     DATE OF BIRTH:  08-09-1951  DATE OF ADMISSION:  12/07/2010 DATE OF DISCHARGE:  12/11/2010                              DISCHARGE SUMMARY   DISCHARGE DIAGNOSES: 1. Bronchitis 2. Hypertension 3. Hematuria 4. Human immunodeficiency virus 5. Tobacco use  DISCHARGE MEDICATIONS: 1. Amlodipine 10 mg by mouth daily. 2. Guaifenesin 200 mg by mouth every 4 hours as needed. 3. Moxifloxacin 400 mg one tablet daily for 3 days. 4. Pseudoephedrine 120 mg twice daily as needed. 5. Aleve 220 mg 1-2 tablets every 8 hours as needed.  DISPOSITION AND FOLLOWUP:  Noah Perez will follow up on  December 29, 2010, at 9 a.m. with Traci Sermon in the [facility]. Follow-Up: 1. Resolution of his upper respiratory/bronchitis symptoms including     shortness of breath. 2. Blood pressure, given new start of 10 mg amlodipine. 3. Viral load drawn this admission, also consider restart HAART-Atripla. 4. Microscopic hematuria.  CTA on admission was negative, may need      cystoscopy as further followup. 5. Normal preventative care including screening and immunizations.   PROCEDURES PERFORMED:  None.  CONSULTATIONS:  None.  BRIEF ADMISSION HISTORY AND PHYSICAL:  Noah Perez is a 60 year old man who is HIV positive, has hypertension, and history of prior admissions for pneumonia who came to the emergency department after having 2 days of shortness of breath and cough.  His cough was occasionally productive of yellow to green sputum.  He stated that his shortness of breath was worse with increased activity.  He has complained of some chills, but had no recorded fevers. He also complained of fatigue over the past week.  He denied nausea, vomiting, chest pain, abdominal pain,  constipation, diarrhea, or any sick contacts.  He smokes half a pack to two-thirds a pack of cigarettes daily and has smoked for 40 years.  Previously, he smoked up to 2 packs per day.  Also of note, he has a history of HIV diagnosed in 1995 with his latest CD4 count being from February 2011 at 200 with a viral load undetectable.  He was started on Atripla, but has not taken his Atripla since August 2011 with difficulties following up with Dr. Orvan Falconer given the fact that he travels a lot for his job.  He has also not been taking any medications for prophylaxis.  ALLERGIES:  PERCOCET which causes nausea and vomiting.  PAST MEDICAL HISTORY:  As noted above in the HPI.  LABORATORY DATA ON ADMISSION:   Chemistries unremarkable.   LFTs normal.   CBC: white count 9.5, platelets 102, differential: neutrophils 80%,  lymphs 12%, monos 8%.   PT 15.7   UA: large amounts of blood, 30 mg/dL protein with negative nitrites,  and negative leukocytes.  On microscopy, the urine was found to have  7-10 red blood cells per high-powered field.  Chest x-ray showed patchy left lower lung interstitial infiltrate.   CT of his chest which  was negative for acute PE or thoracic aortic  dissection, showed coronary and aortic calcifications and patchy  bibasilar airspace opacities, perhaps suggesting early pneumonia.  PHYSICAL EXAMINATION ON ADMISSION: VITAL SIGNS:  Temperature 98, pulse 80, blood pressure 159/98,  respiratory rate 18, O2 sats 94% on room air. GENERAL:  Alert and cooperative in no acute distress.  Alert and  oriented x3. HEAD:  Normocephalic and atraumatic. EYES:  Pupils equal, round, and reactive to light.  Extraocular movements intact.  Vision intact.  Conjunctiva and sclerae clear. MOUTH:  Moist mucous membranes.  Oropharynx clear. NECK:  Supple, full range of motion.  No JVD, carotid bruits, or palpable lymphadenopathy. LUNGS:  Normal respiratory effort, very mild fine crackles with  deep inspiration in the bases with expiratory wheezes in the bases bilaterally. HEART:  Regular rate and rhythm.  No murmurs, rubs, or gallops. ABDOMEN:  Soft, nontender, nondistended.  Bowel sounds present and normoactive.  No palpable masses or HSM. MSK:  No joint swelling, warmth, or erythema. NEUROLOGIC:  Cranial nerves II through XII intact.  5/5 strength globally.  Sensation to fine touch grossly intact.  Gait normal. SKIN:  Turgor normal and scaly plaques on elbows and knees bilaterally. PSYCH:  Memory intact for recent and remote, normally interactive, good eye contact, affect as expected.    HOSPITAL COURSE BY PROBLEM: 1. Viral URI/bronchitis.  Upon admission, Noah Perez also complained     of congestion in his sinuses.  He said that it was difficult for     him to breathe due to nasal congestion and was a     significant component of his recent illness.  During the course of     his hospitalization, he was treated supportively with Sudafed and     guaifenesin, both of which helped to relieve his symptoms of     congestion and helped with his cough significantly.  He also was     started on moxifloxacin for which he will finish his 7-day course.     He remained afebrile without leukocytosis.  His cough slowly      improved, initially was productive of only clear sputum and then      became nonproductive. He was only coughing rarely on the day of      discharge.  His shortness of breath also improved during the      hospitalization.  His oxygen saturations remained greater than 95%      on room air.  He also did not have any drop in his oxygen saturations      when walking up and down the hall.  Sputum cultures were taken.  No      organisms were found on sputum stain, AFB negative.  Sputum was sent      for PCP antigen staining.  He was initially started on double-strength      Bactrim 2 tablets q.8 for treatment of PJP, but this was discontinued      after further  physical examination and additional history of his recent      illness was obtained. 2. Hypertension.  Noah Perez carries the diagnosis of     hypertension and has not been on antihypertensives.  His blood      pressures ranged from the 130s to 160s over 80s to 90s.  He was      started on 10 mg of amlodipine 2 days before discharge and he will      follow up as an outpatient  for further management.   3. HIV.  Noah Perez CD4 count on admission was 280, reducing     the suspicion of opportunistic infection being the cause of his     symptoms.  Viral load was drawn and is pending.  He has been off of     his HAART therapy since last August due to difficulties following     up because of work-related travel.  He is willing to restart HAART      but is unable to see Dr. Orvan Falconer given his availability is limited      to Fridays with his work-travel schedule.  He will therefore be      followed up as an outpatient with Mr. Sharlee Blew.  4. Microscopic hematuria.  Noah Perez was found to have blood on his     UA, was asymptomatic, and had no urinary complaints including no     CVA tenderness or pain.  CT was performed which did not show     any evidence of stones, other obvious kidney parenchymal disease, and     showed normal bladder filling.  We recommend he have an outpatient      cystoscopy as he has been a long-term smoker for many years.  5. Smoking.  Noah Perez continues to smoke cigarettes and has for a     longtime. He did not express an interest in quitting smoking during     this hospitalization.  DISCHARGE LABORATORY DATA:  PJP antigen testing on the sputum was negative. Respiratory culture from his sputum only grew normal oropharyngeal flora.  AFB was negative.  VITAL SIGNS ON THE DAY OF DISCHARGE:  Temperature 97.6, pulse 61, respirations 20, blood pressure 163/94, O2 sat 98% on room air.   ______________________________ Darnelle Maffucci,  MD   ______________________________ Doneen Poisson, MD   PT/MEDQ  D:  12/11/2010  T:  12/12/2010  Job:  [id number]  cc:   [facility]  Electronically Signed by Darnelle Maffucci  on 12/13/2010 04:04:49 PM Electronically Signed by Doneen Poisson  on 12/22/2010 07:08:25 PM

## 2010-12-29 ENCOUNTER — Other Ambulatory Visit: Payer: Self-pay | Admitting: Adult Health

## 2010-12-29 ENCOUNTER — Encounter: Payer: Self-pay | Admitting: Adult Health

## 2010-12-29 ENCOUNTER — Ambulatory Visit (INDEPENDENT_AMBULATORY_CARE_PROVIDER_SITE_OTHER): Payer: 59 | Admitting: Adult Health

## 2010-12-29 DIAGNOSIS — J209 Acute bronchitis, unspecified: Secondary | ICD-10-CM | POA: Insufficient documentation

## 2010-12-29 DIAGNOSIS — M6281 Muscle weakness (generalized): Secondary | ICD-10-CM | POA: Insufficient documentation

## 2010-12-29 DIAGNOSIS — Z9119 Patient's noncompliance with other medical treatment and regimen: Secondary | ICD-10-CM

## 2010-12-29 DIAGNOSIS — G4700 Insomnia, unspecified: Secondary | ICD-10-CM

## 2010-12-29 DIAGNOSIS — Z91199 Patient's noncompliance with other medical treatment and regimen due to unspecified reason: Secondary | ICD-10-CM | POA: Insufficient documentation

## 2010-12-29 DIAGNOSIS — I10 Essential (primary) hypertension: Secondary | ICD-10-CM

## 2010-12-29 DIAGNOSIS — B20 Human immunodeficiency virus [HIV] disease: Secondary | ICD-10-CM

## 2011-01-02 NOTE — Assessment & Plan Note (Signed)
Summary: Office Visit - Infectious Disease    Current Allergies: ! PERCOCET  Other Orders: Est. Patient Level V (04540)

## 2011-01-02 NOTE — Assessment & Plan Note (Signed)
Summary: HFU/VS   Vital Signs:  Patient profile:   60 year old male Height:      71 inches Weight:      139.8 pounds BMI:     19.57 Temp:     97.6 degrees F oral Pulse rate:   71 / minute BP sitting:   134 / 84  (right arm)  Vitals Entered By: Alesia Morin CMA (December 29, 2010 9:11 AM) CC: Pt in today to reestablish himself with a doctor and get started on meds Is Patient Diabetic? No Pain Assessment Patient in pain? no      Nutritional Status BMI of < 19 = underweight Nutritional Status Detail appetite "good"  Have you ever been in a relationship where you felt threatened, hurt or afraid?No   Does patient need assistance? Functional Status Self care Ambulation Normal Comments pt been off meds for 8 months working in and out of the state and had a hard time making and keeping appts.   CC:  Pt in today to reestablish himself with a doctor and get started on meds.  History of Present Illness: Recent dschg from hospital for bronchitis, hypertension, and  hemeturia.  Not seen in clinic since 02/11, and no meds for past 8 months.  Most recent CD4 was 280 while in hospital.  Difficulty with f/u due to travel for job.  No new major comlaints voiced today.  Still with residual cough.  completed remainder of Avelox therapy. On Amlodipine for BP.  Also endorses new onset late afternoon muscle weakness in legs and extreme "tiredness" since discharge from hospital.  States never had these symptoms before.  Also relates new onset of insomnia when he is able to fall asleep but wakes up in middle of night.  Symptoms also developed shortly after hospitalization.  Preventive Screening-Counseling & Management  Alcohol-Tobacco     Alcohol drinks/day: 0     Smoking Status: current     Smoking Cessation Counseling: yes     Packs/Day: 0.5     Year Quit: 11/05/2009  Caffeine-Diet-Exercise     Caffeine use/day: yes     Does Patient Exercise: no  Hep-HIV-STD-Contraception     Hepatitis  Risk: no risk noted  Safety-Violence-Falls     Seat Belt Use: no      Sexual History:  currently monogamous.        Drug Use:  never.        Blood Transfusions:  no.        Travel History:  no.    Comments: pt declined condoms  Allergies: 1)  ! Percocet  Past History:  Past medical, surgical, family and social histories (including risk factors) reviewed for relevance to current acute and chronic problems.  Past Medical History: Reviewed history from 06/03/2009 and no changes required. HIV disease Hypertension Pneumonia, hx of  Family History: Reviewed history and no changes required.  Social History: Reviewed history and no changes required. Blood Transfusions:  no Travel History:  no  Review of Systems       The patient complains of weight loss and muscle weakness.  The patient denies anorexia, fever, weight gain, vision loss, decreased hearing, hoarseness, chest pain, syncope, dyspnea on exertion, peripheral edema, prolonged cough, headaches, hemoptysis, abdominal pain, melena, hematochezia, severe indigestion/heartburn, hematuria, incontinence, genital sores, suspicious skin lesions, transient blindness, difficulty walking, depression, unusual weight change, abnormal bleeding, enlarged lymph nodes, angioedema, and testicular masses.    Physical Exam  General:  alert, well-developed, well-hydrated, and cachetic.  Head:  normocephalic, atraumatic, no abnormalities observed, and no abnormalities palpated.   Eyes:  vision grossly intact, pupils equal, pupils round, and pupils reactive to light.   Ears:  R ear normal and L ear normal.   Nose:  no external deformity, no external erythema, and no nasal discharge.   Mouth:  good dentition, no gingival abnormalities, pharynx pink and moist, poor dentition, and teeth missing.   Neck:  supple, full ROM, and no masses.   Chest Wall:  no deformities, no tenderness, and no masses.   Lungs:  normal respiratory effort, no  intercostal retractions, no accessory muscle use, and normal breath sounds.   Heart:  normal rate, regular rhythm, no murmur, no gallop, no rub, and no JVD.   Abdomen:  soft, non-tender, normal bowel sounds, no distention, no masses, no hepatomegaly, and no splenomegaly.   Rectal:  DEFERRED Genitalia:  DEFERRED Prostate:  DEFERRED Msk:  normal ROM, no joint tenderness, and no joint swelling.   Extremities:  No clubbing, cyanosis, edema, or deformity noted with normal full range of motion of all joints.   Neurologic:  alert & oriented X3, cranial nerves II-XII intact, strength normal in all extremities, and gait normal.   Skin:  (+) facial seborrhea Cervical Nodes:  Small shoddy LAD Axillary Nodes:  Small shoddy LAD Psych:  Oriented X3, memory intact for recent and remote, normally interactive, good eye contact, not anxious appearing, not depressed appearing, and not agitated.     Impression & Recommendations:  Problem # 1:  BRONCHITIS, ACUTE (ICD-466.0) Assessment Improved Completed course of Avelox therapy.  Appears resolved His updated medication list for this problem includes:    Bactrim Ds 800-160 Mg Tabs (Sulfamethoxazole-trimethoprim) .Marland Kitchen... Take one (1) tablet once a day  Problem # 2:  HYPERTENSION (ICD-401.9) Will monitor response with amlodipine.  SE's, ADR's, and potential interactions discussed.  Verbally acknowledged His updated medication list for this problem includes:    Norvasc 10 Mg Tabs (Amlodipine besylate) ..... Once daily  Orders: Est. Patient Level IV (99214)Future Orders: T-Lipid Profile ([id number]) ... 01/26/2011  Problem # 3:  INSOMNIA (ICD-780.52) Recommended Alteril 1-2 tablets by mouth at bedtime for sleep.  Informed him his sleep patterns have been altered by recent acute illness and hospitalization.  In order to achieve healthy sleep patterns, it was recommended he try natural methods as Alteril first before we provide other pharmaceuticals.  Verbally  acknowledged this and agreed with plan of care. The following medications were removed from the medication list:    Ambien 5 Mg Tabs (Zolpidem tartrate) .Marland Kitchen... Take 1 tablet by mouth at bedtime as needed for sleep  Problem # 4:  HIV DISEASE (ICD-042) We need HIV RNA today and recommend a reflex to genotype to determine any possible resistance.  We will resume Atripla for now, have him RTC in 4 weeks to have repeat labs drawn and a f/u in 6 weeks with me.  Verbally acknowledged this and agreed to be adherent with his medication.   Also as his CD4 fluctuates above and below 200 cells/mm3, we will resume PCP prophylaxis today until we see stability >200 His updated medication list for this problem includes:    Bactrim Ds 800-160 Mg Tabs (Sulfamethoxazole-trimethoprim) .Marland Kitchen... Take one (1) tablet once a day  Orders: T-HIV1 Quant rflx Ultra or Genotype ([id number]) Est. Patient Level IV (99214)Future Orders: T-CBC w/Diff ([id number]) ... 01/26/2011 T-CD4SP ([facility]) (CD4SP) ... 01/26/2011 T-Comprehensive Metabolic Panel [id number]) ... 01/26/2011 T-HIV Viral Load [id number]) ... 01/26/2011  Problem # 5:  MUSCLE WEAKNESS (GENERALIZED) (ICD-728.87) Assessment: Improved These symptoms developed shortly following hospitalization.  It can be associated with fatigue from his recent acute illness.  Will monitor this and have him contact us if symptoms persist or worsen.  Verbally agreed with this plan for now.  Medications Added to Medication List This Visit: 1)  Norvasc 10 Mg Tabs (Amlodipine besylate) .... Once daily 2)  Atripla 600-200-300 Mg Tabs (Efavirenz-emtricitab-tenofovir) .... Take one (1) tablet once a day at bedtime on empty stomach 3)  Bactrim Ds 800-160 Mg Tabs (Sulfamethoxazole-trimethoprim) .... Take one (1) tablet once a day 4)  Triamcinolone Acetonide 0.1 % Oint (Triamcinolone acetonide) .... Apply to affected area two times a day (use sparingly) Prescriptions: TRIAMCINOLONE  ACETONIDE 0.1 % OINT (TRIAMCINOLONE ACETONIDE) Apply to affected area two times a day (use sparingly)  #30gm x 1   Entered and Authorized by:   Talmadge Chad NP   Signed by:   Talmadge Chad NP on 12/29/2010   Method used:   Print then Give to Patient   RxID:   [id number] BACTRIM DS 800-160 MG TABS (SULFAMETHOXAZOLE-TRIMETHOPRIM) Take one (1) tablet once a day  #30 x 5   Entered and Authorized by:   Talmadge Chad NP   Signed by:   Talmadge Chad NP on 12/29/2010   Method used:   Print then Give to Patient   RxID:   [id number] ATRIPLA 600-200-300 MG TABS (EFAVIRENZ-EMTRICITAB-TENOFOVIR) Take one (1) tablet once a day at bedtime on empty stomach  #30 x 1   Entered and Authorized by:   Talmadge Chad NP   Signed by:   Talmadge Chad NP on 12/29/2010   Method used:   Print then Give to Patient   RxID:   [id number]    Orders Added: 1)  T-HIV1 Quant rflx Ultra or Genotype [[id number]] 2)  T-CBC w/Diff [[id number]] 3)  T-CD4SP ([facility]) [CD4SP] 4)  T-Comprehensive Metabolic Panel [[id number]] 5)  T-HIV Viral Load [[id number]] 6)  T-Lipid Profile [[id number]] 7)  Est. Patient Level IV [29518]

## 2011-01-12 LAB — HIV-1 GENOTYPR PLUS

## 2011-01-20 LAB — DIFFERENTIAL
Basophils Absolute: 0.1 10*3/uL (ref 0.0–0.1)
Basophils Absolute: 0.1 10*3/uL (ref 0.0–0.1)
Basophils Relative: 1 % (ref 0–1)
Eosinophils Relative: 1 % (ref 0–5)
Eosinophils Relative: 1 % (ref 0–5)
Lymphocytes Relative: 12 % (ref 12–46)
Lymphocytes Relative: 14 % (ref 12–46)
Lymphs Abs: 1.4 10*3/uL (ref 0.7–4.0)
Monocytes Absolute: 1.1 10*3/uL — ABNORMAL HIGH (ref 0.1–1.0)
Neutro Abs: 7.2 10*3/uL (ref 1.7–7.7)
Neutro Abs: 9 10*3/uL — ABNORMAL HIGH (ref 1.7–7.7)

## 2011-01-20 LAB — CBC
HCT: 41 % (ref 39.0–52.0)
Hemoglobin: 13.7 g/dL (ref 13.0–17.0)
MCHC: 35.1 g/dL (ref 30.0–36.0)
MCHC: 35.1 g/dL (ref 30.0–36.0)
MCV: 87.3 fL (ref 78.0–100.0)
MCV: 87.3 fL (ref 78.0–100.0)
Platelets: 245 10*3/uL (ref 150–400)
RBC: 4.46 MIL/uL (ref 4.22–5.81)
RDW: 12.8 % (ref 11.5–15.5)
WBC: 10 10*3/uL (ref 4.0–10.5)

## 2011-01-20 LAB — COMPREHENSIVE METABOLIC PANEL
AST: 28 U/L (ref 0–37)
AST: 28 U/L (ref 0–37)
Albumin: 3.1 g/dL — ABNORMAL LOW (ref 3.5–5.2)
Alkaline Phosphatase: 80 U/L (ref 39–117)
BUN: 16 mg/dL (ref 6–23)
BUN: 24 mg/dL — ABNORMAL HIGH (ref 6–23)
CO2: 26 mEq/L (ref 19–32)
Calcium: 8.7 mg/dL (ref 8.4–10.5)
Chloride: 106 mEq/L (ref 96–112)
Chloride: 96 mEq/L (ref 96–112)
Creatinine, Ser: 0.84 mg/dL (ref 0.4–1.5)
Creatinine, Ser: 1 mg/dL (ref 0.4–1.5)
GFR calc Af Amer: >60 mL/min (ref >60)
GFR calc non Af Amer: >60 mL/min (ref >60)
Total Bilirubin: 0.4 mg/dL (ref 0.3–1.2)
Total Protein: 8.3 g/dL (ref 6.0–8.3)

## 2011-01-20 LAB — LIPASE, BLOOD: Lipase: 23 U/L (ref 11–59)

## 2011-01-20 LAB — CULTURE, BLOOD (ROUTINE X 2)
Culture: NO GROWTH
Culture: NO GROWTH

## 2011-01-20 LAB — POCT I-STAT 3, ART BLOOD GAS (G3+)
Bicarbonate: 23.9 mEq/L (ref 20.0–24.0)
pCO2 arterial: 34.2 mmHg — ABNORMAL LOW (ref 35.0–45.0)
pH, Arterial: 7.453 — ABNORMAL HIGH (ref 7.350–7.450)
pO2, Arterial: 77 mmHg — ABNORMAL LOW (ref 80.0–100.0)

## 2011-01-20 LAB — URINALYSIS, ROUTINE W REFLEX MICROSCOPIC
Hgb urine dipstick: NEGATIVE
Ketones, ur: NEGATIVE mg/dL
Protein, ur: NEGATIVE mg/dL
Urobilinogen, UA: 0.2 mg/dL (ref 0.0–1.0)

## 2011-01-20 LAB — T-HELPER CELLS (CD4) COUNT (NOT AT ARMC)
CD4 % Helper T Cell: 30 % — ABNORMAL LOW (ref 33–55)
CD4 T Cell Abs: 260 uL — ABNORMAL LOW (ref 400–2700)

## 2011-01-21 LAB — DIFFERENTIAL
Basophils Absolute: 0 10*3/uL (ref 0.0–0.1)
Eosinophils Absolute: 0.2 10*3/uL (ref 0.0–0.7)
Eosinophils Relative: 2 % (ref 0–5)
Monocytes Absolute: 0.9 10*3/uL (ref 0.1–1.0)

## 2011-01-21 LAB — BASIC METABOLIC PANEL
BUN: 16 mg/dL (ref 6–23)
BUN: 8 mg/dL (ref 6–23)
CO2: 27 mEq/L (ref 19–32)
CO2: 27 mEq/L (ref 19–32)
Calcium: 8.5 mg/dL (ref 8.4–10.5)
Chloride: 101 mEq/L (ref 96–112)
Chloride: 101 mEq/L (ref 96–112)
Chloride: 105 mEq/L (ref 96–112)
GFR calc Af Amer: >60 mL/min (ref >60)
GFR calc Af Amer: >60 mL/min (ref >60)
GFR calc Af Amer: >60 mL/min (ref >60)
GFR calc non Af Amer: 59 mL/min — ABNORMAL LOW (ref >60)
GFR calc non Af Amer: >60 mL/min (ref >60)
Glucose, Bld: 102 mg/dL — ABNORMAL HIGH (ref 70–99)
Glucose, Bld: 87 mg/dL (ref 70–99)
Potassium: 3.9 mEq/L (ref 3.5–5.1)
Potassium: 4 mEq/L (ref 3.5–5.1)
Potassium: 4.2 mEq/L (ref 3.5–5.1)
Potassium: 4.8 mEq/L (ref 3.5–5.1)
Sodium: 131 mEq/L — ABNORMAL LOW (ref 135–145)
Sodium: 133 mEq/L — ABNORMAL LOW (ref 135–145)
Sodium: 135 mEq/L (ref 135–145)
Sodium: 137 mEq/L (ref 135–145)

## 2011-01-21 LAB — CBC
HCT: 37.1 % — ABNORMAL LOW (ref 39.0–52.0)
HCT: 37.5 % — ABNORMAL LOW (ref 39.0–52.0)
HCT: 37.8 % — ABNORMAL LOW (ref 39.0–52.0)
HCT: 38.3 % — ABNORMAL LOW (ref 39.0–52.0)
Hemoglobin: 13 g/dL (ref 13.0–17.0)
Hemoglobin: 13.1 g/dL (ref 13.0–17.0)
Hemoglobin: 13.5 g/dL (ref 13.0–17.0)
MCHC: 35.1 g/dL (ref 30.0–36.0)
MCV: 87.1 fL (ref 78.0–100.0)
MCV: 87.2 fL (ref 78.0–100.0)
Platelets: 247 10*3/uL (ref 150–400)
Platelets: 296 10*3/uL (ref 150–400)
RBC: 4.24 MIL/uL (ref 4.22–5.81)
RBC: 4.3 MIL/uL (ref 4.22–5.81)
RBC: 4.4 MIL/uL (ref 4.22–5.81)
RDW: 12.8 % (ref 11.5–15.5)
RDW: 13 % (ref 11.5–15.5)
WBC: 6.5 10*3/uL (ref 4.0–10.5)
WBC: 7.4 10*3/uL (ref 4.0–10.5)

## 2011-01-21 LAB — LIPID PANEL
Cholesterol: 88 mg/dL (ref 0–200)
HDL: 25 mg/dL — ABNORMAL LOW (ref >39)
Total CHOL/HDL Ratio: 3.5 RATIO

## 2011-01-21 LAB — AFB CULTURE WITH SMEAR (NOT AT ARMC): Acid Fast Smear: NONE SEEN

## 2011-01-21 LAB — CULTURE, BLOOD (SINGLE)

## 2011-01-21 LAB — CRYPTOCOCCAL ANTIGEN

## 2011-01-21 LAB — SEDIMENTATION RATE: Sed Rate: 49 mm/hr — ABNORMAL HIGH (ref 0–16)

## 2011-01-26 ENCOUNTER — Other Ambulatory Visit: Payer: 59

## 2011-01-26 DIAGNOSIS — B20 Human immunodeficiency virus [HIV] disease: Secondary | ICD-10-CM

## 2011-01-26 LAB — T-HELPER CELL (CD4) - (RCID CLINIC ONLY)
CD4 % Helper T Cell: 28 % — ABNORMAL LOW (ref 33–55)
CD4 T Cell Abs: 270 uL — ABNORMAL LOW (ref 400–2700)

## 2011-01-26 NOTE — Progress Notes (Signed)
Addended by: Mariea Clonts on: 01/26/2011 02:17 PM   Modules accepted: Orders

## 2011-01-27 LAB — CBC WITH DIFFERENTIAL/PLATELET
Hemoglobin: 14.9 g/dL (ref 13.0–17.0)
Lymphocytes Relative: 19 % (ref 12–46)
Lymphs Abs: 1.1 10*3/uL (ref 0.7–4.0)
MCV: 89 fL (ref 78.0–100.0)
Monocytes Relative: 7 % (ref 3–12)
Neutrophils Relative %: 71 % (ref 43–77)
Platelets: 159 10*3/uL (ref 150–400)
RBC: 4.92 MIL/uL (ref 4.22–5.81)
WBC: 5.5 10*3/uL (ref 4.0–10.5)

## 2011-01-27 LAB — LIPID PANEL
LDL Cholesterol: 63 mg/dL (ref 0–99)
Total CHOL/HDL Ratio: 2.8 Ratio
VLDL: 12 mg/dL (ref 0–40)

## 2011-01-27 LAB — COMPLETE METABOLIC PANEL WITH GFR
ALT: 20 U/L (ref 0–53)
Albumin: 4 g/dL (ref 3.5–5.2)
CO2: 28 mEq/L (ref 19–32)
Calcium: 9.3 mg/dL (ref 8.4–10.5)
Chloride: 105 mEq/L (ref 96–112)
GFR, Est African American: >60 mL/min (ref >60)
Sodium: 138 mEq/L (ref 135–145)
Total Protein: 7.8 g/dL (ref 6.0–8.3)

## 2011-02-01 ENCOUNTER — Other Ambulatory Visit: Payer: Self-pay | Admitting: Internal Medicine

## 2011-02-12 LAB — URINALYSIS, ROUTINE W REFLEX MICROSCOPIC
Bilirubin Urine: NEGATIVE
Glucose, UA: NEGATIVE mg/dL
Ketones, ur: NEGATIVE mg/dL
pH: 6 (ref 5.0–8.0)

## 2011-02-12 LAB — URINE CULTURE
Colony Count: NO GROWTH
Culture: NO GROWTH

## 2011-02-16 ENCOUNTER — Ambulatory Visit: Payer: 59 | Admitting: Adult Health

## 2011-02-16 ENCOUNTER — Other Ambulatory Visit: Payer: Self-pay | Admitting: Adult Health

## 2011-02-16 DIAGNOSIS — B20 Human immunodeficiency virus [HIV] disease: Secondary | ICD-10-CM

## 2011-02-23 ENCOUNTER — Ambulatory Visit: Payer: 59 | Admitting: Adult Health

## 2011-03-02 ENCOUNTER — Other Ambulatory Visit: Payer: Self-pay | Admitting: *Deleted

## 2011-03-02 DIAGNOSIS — B20 Human immunodeficiency virus [HIV] disease: Secondary | ICD-10-CM

## 2011-03-02 MED ORDER — EFAVIRENZ-EMTRICITAB-TENOFOVIR 600-200-300 MG PO TABS: 1.0000 | ORAL_TABLET | Freq: Every day | ORAL | Status: DC

## 2011-03-05 ENCOUNTER — Other Ambulatory Visit: Payer: Self-pay | Admitting: Licensed Clinical Social Worker

## 2011-03-05 DIAGNOSIS — B20 Human immunodeficiency virus [HIV] disease: Secondary | ICD-10-CM

## 2011-03-05 MED ORDER — EFAVIRENZ-EMTRICITAB-TENOFOVIR 600-200-300 MG PO TABS: 1.0000 | ORAL_TABLET | Freq: Every day | ORAL | Status: DC

## 2011-03-16 ENCOUNTER — Ambulatory Visit: Payer: 59 | Admitting: Adult Health

## 2011-03-20 NOTE — Op Note (Signed)
NAME:  Noah Perez, Noah Perez          ACCOUNT NO.:  [id number]   MEDICAL RECORD NO.:  [id number]          PATIENT TYPE:  AMB   LOCATION:  [facility]                         FACILITY:  [facility]   PHYSICIAN:  Anselm Pancoast. Weatherly, M.D.DATE OF BIRTH:  1951/01/01   DATE OF PROCEDURE:  04/03/2007  DATE OF DISCHARGE:                               OPERATIVE REPORT   PREOPERATIVE DIAGNOSIS:  Large left inguinal hernia.   POSTOPERATIVE DIAGNOSIS:  Left indirect sliding inguinal hernia.   OPERATIONS:  Left inguinal herniorrhaphy, with mesh reinforcement.   ANESTHESIA:  General.   HISTORY:  Noah Perez is a 60 year old male who has had a left  inguinal hernia for many years.  I saw him in the office about 2 months  ago, and he erects metal buildings, doing a lot of climbing and  strenuous activities.  He is kind of thin and lanky, but he had an  obvious lemon-sized hernia that would protrude out with any type of  straining but was easily reduce.  He was scheduled for repair now.  He  has not had any blood in his stool, and no definite urinary type  symptoms.  Preoperatively, I did have him take a bottle of Mag-Citrate  yesterday to evacuate his colon, and he is here for the scheduled  procedure.  His laboratory studies showed a white count of 5100, a  hematocrit of 42, and a CMET, which were all normal.  A chest x-ray  showed no acute cardiac or pulmonary process.  I am not sure where, but  on the records here at the day surgery it said he was diagnosed as  positive HIV. diagnosed in 1996.  I do not have any records regarding  that, and he certainly is not on any type of medication for this at this  time.  Preoperatively, he was given 1 g of Kefzol. The patient's left  side had been marked, and then he was taken back to the operative suite.  An LOA tube was placed, and then the left groin was clipped and then  prepped with Betadine surgical solution and draped in a sterile manner.  He is fairly  thin, and the subcutaneous tissue in the inguinal incision  area was infiltrated with a mixture of 0.5% Xylocaine and 0.25 Marcaine  with adrenaline, and the ilioinguinal nerve area was anesthetized with  about 10 cc of saline solution with a blunted 22 gauge needle.  Sharp  dissection down through the skin and subcutaneous tissue.  The external  oblique was opened.  There were two veins superficial that were clamped,  divided, and ligated with fine Vicryl.  The cord structures were  elevated, and this was noted to be a fairly large indirect hernia sac  that had certainly weakened the floor, and I carefully dissected the  hernia sac from the cord structures without damaging the structures.  Upon opening the sac, there was a sliding loop of the sigmoid colon that  was adherent, and this was freed up so that the colon could be reduced  into the peritoneal cavity, and then I could do a high sac ligation with  0 Surgilon and a second suture just distal.  The hernia sac was removed,  and then the floor was reinforced with a running 2-0 Prolene in kind of  a Sholdice manner, and then the two ends were tied together.  A piece of  Prolene mesh shaped like a sail slit laterally was used to reinforce the  floor. It is under the ilioinguinal nerve, which was brought out with  the cord structures, and the inferior limb was sutured down with a  running 2-0 Prolene, and the superior flap was sutured down with  interrupted sutures of either 2-0 Prolene or 2-0 Vicryl.  The floor was  anesthetized with about 5 or 6 cc of the same solution, and I used a  little bit where the high sac ligation was performed to minimize the  immediate postoperative pain.  The external oblique was closed also with  a running 2-0 Vicryl, making sure that we did not put any pressure on  the cord structures, and then Scarpa fascia was closed with interrupted  3-0 Vicryl, 4-0 Dexon subcuticular, and  Benzoin and Steri-Strips on  the skin.  Operating time was about an hour  and a half. The patient tolerated procedure nicely, and he will be  released after a short stay in the recovery room.  He hopes to back to  work limited basis in approximately a week, and was instructed not to do  any strenuous activity for approximately 4 weeks.           ______________________________  Anselm Pancoast. Zachery Dakins, M.D.     WJW/MEDQ  D:  04/03/2007  T:  04/03/2007  Job:  [id number]

## 2011-03-23 NOTE — Discharge Summary (Signed)
NAME:  Noah Perez, TALL NO.:  [id number]   MEDICAL RECORD NO.:  [id number]                   PATIENT TYPE:  INP   LOCATION:  5505                                 FACILITY:  [facility]   PHYSICIAN:  Alvester Morin, M.D.               DATE OF BIRTH:  30-Oct-1951   DATE OF ADMISSION:  08/25/2002  DATE OF DISCHARGE:  08/28/2002                                 DISCHARGE SUMMARY   RESIDENT PHYSICIANS:  Resident physician, Blanch Media, M.D. and Acting  Intern, Oneal Grout, MS-4.   DISCHARGE DIAGNOSES:  1. Left lower lobe pneumonia, community acquired.  2. Dehydration.  3. Hyponatremia.  4. Human immunodeficiency virus positive since 1996.  5. Hiccoughs.  6. Thrombocytopenia.  7. Hypokalemia.   DISCHARGE MEDICATIONS:  1. Tequin 400 mg one pill p.o. q.d. x11 days.  2. Bactrim DS one pill p.o. every Monday, Wednesday, and Friday for PCP     prophylaxis.   DISPOSITION AND FOLLOWUP:  I have called the infectious disease clinic at  [facility] and asked them to discuss followup with Dr. Cliffton Asters who has seen the patient in the past however, it has been 3 years  since his last visit with Dr. Orvan Falconer.  They will call the patient with a  followup visit.  I have instructed the patient to call the internal medicine  resident clinic and ask for a hospital followup visit if he is not contacted  and scheduled to be seen in the infectious disease clinic within 3 weeks of  his discharge from the hospital.   CONSULTATIONS:  No official consultation was obtained during this  hospitalization although Dr. Cliffton Asters did see this patient as a  courtesy visit.   HISTORY OF PRESENT ILLNESS:  The patient is a 60 year old male who is HIV  positive and had presented to the emergency department with his girlfriend  due to increasing lethargy over the last 3 days.  He has been HIV positive  since 1996 and has taken no antiretroviral medications for the past  3 years  and has not seen an infectious disease physician in 3 years.  According to  his girlfriend the patient has been disoriented x1 day prior to his  admission.  He has missed work all weekend and seems to have just been very  tired and he stayed in bed and has not eaten very much at all.  He reports  no odynophagia, no fevers, no chills.  He also denies nausea and vomiting  and diarrhea.  He has no cough, no shortness of breath, and no abdominal  pain.  He has taken no regular medications for approximately 3 years.   PHYSICAL EXAMINATION:  VITAL SIGNS: Pulse 95, blood pressure 100/76,  temperature 100.7, respiratory rate 20, O2 saturation 95% on room air.  GENERAL: The patient is a cachectic white male who is alert and oriented x3.  EYES: PERRLA.  EOMI.  Sclerae are clear, nonicteric.  ENT: Very poor dentition.  No erythema or exudate noted in oral cavity.  NECK: No JVD, no thyromegaly or thyroid nodules palpated.  RESPIRATORY: Left lower lobe crackles heard posteriorly and laterally.  No  wheezes are appreciated.  CARDIOVASCULAR: Heart is regular rate and rhythm.  Normal S1 and S2.  No  murmurs, rubs, or gallops are appreciated.  No lower extremity edema is  noted.  GI: Abdomen is soft, nontender, nondistended, normoactive bowel sounds.  No  hepatosplenomegaly.  EXTREMITIES: Warm, dry with no edema.  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  MUSCULOSKELETAL: He has normal range of motion in all extremities.  There is  no joint edema or tenderness.  NEUROLOGIC: He is alert and oriented x3.  His cranial nerves II-XII are  grossly intact with no focal deficits.   ADMISSION LABORATORIES:  Sodium 124, potassium 3.5, chloride 85, bicarb 29,  BUN 28, creatinine 1.5, glucose 152.  White blood cell count 10.3,  hemoglobin 14.8, platelet count 120, absolute neutrophil count 9.3.  He had  an anion gap of 10, bilirubin is 1.3, alk phos is 54, AST is 47, ALT is  22,  protein 7.5, albumin is 2.8, calcium is 8.9.  Chest x-ray from the emergency  department is consistent with a left lower lobe pneumonia infiltrate.   HOSPITAL COURSE:  #1 - LEFT LOWER LOBE PNEUMONIA.  Secondary to physical  findings and chest x-ray findings the patient was diagnosed with left lower  lobe pneumonia.  Because of his history of HIV positivity and lack of  antiretroviral therapy for a total of 3 years we were concerned about  potential opportunistic infections.  In the emergency room, he was started  on ceftriaxone and azithromycin.  Blood cultures were obtained.  One out of  two blood cultures returned positive for Streptococcus viridans.  This blood  culture was felt to be either a contaminant or secondary to poor dentition  and recent tooth abscess.  A CD4 count was obtained and was found to be at  150 - this was down from a level of 310 from December 1998.  LDH was also  obtained and was within normal range at a level of 243.  Given his clinical  laboratory and chest x-ray findings, we were most convinced that this  pneumonia was likely community acquired and probably Streptococcus  pneumoniae.  He received 3 days of Rocephin 2 g IV each day and he will be  discharged on 11 days of Tequin 400 mg q.d. to finish out treatment for this  pneumonia.  Azithromycin was not continued as this was not consistent with  an atypical pneumonia or MAC.  He had been afebrile for a total of 48 hours  upon discharge from the hospital and was feeling much improved.   #2 -  DEHYDRATION.  Owing to his infection, the patient had not eaten very  little for several days for prior to admission.  His BUN and creatinine were  significantly elevated and consistent with a dehydration picture.  He was  given approximately 4 L of normal saline in the emergency department before coming to his hospital bed.  Normal saline was continued for a total of 2-  1/2 days during this hospitalization.  He  began feeling better while in the  hospital and had a significant improvement in his p.o. intake and IV fluids  were discontinued.  His BUN and creatinine improved quite nicely following  rehydration.   #3 -  HYPONATREMIA.  His admission sodium was 124.  Possible causes for  hyponatremia which we considered were dehydration or SIADH secondary to his  pneumonia.  With rehydration, his sodium began to correct and at discharge  his sodium level is at 131.   #4 -  HUMAN IMMUNODEFICIENCY VIRUS INFECTION.  He has not been seen in the  infectious disease clinic for over 3 years.  He has been on no  antiretroviral therapy since that time.  He does have a history of being on  trial therapy in the past.  Again his CD4 during this hospitalization was  found to be at the level of 150.  His reasons for not being on medication  seem to be mostly due to their cost and his lack of insurance.  We have  assisted him and he has applied for ADAP for drug assistance.  Hopefully, he  will be able to follow up in the infectious disease clinic and be restarted  on medications for his HIV infection.  We will discharge him on Bactrim  double strength one p.o. q.d. every Monday, Wednesday, Friday for PCP  prophylaxis as his CD4 count is less than 200.   #5 -  MENTAL STATUS CHANGES.  The patient came in with his girlfriend who  was concerned about his recent confusion and disorientation while at home.  His first night in the hospital he did try to leave the hospital and thought  that [place] was at the beach.  His pulled out IV, packed his clothes and tried  to walk out of the hospital.  He was easily redirected by hospital staff and  he returned to his room quietly and without sedation and stayed the rest of  the night here in the hospital.  The following night the patient had no  problems with disorientation and expressed no other confusion throughout his  hospital stay.  It is most likely this change in mental  status was due to  his infection or hyponatremia.  We had discussed possible imaging of his  brain over concerns of possible HIV-associated infections like toxoplasmosis  or _________ .  No imaging was obtained because he improved so much  following that first night of admission with antibiotic and IV rehydration  therapy.   #6 -  HICCOUGHS.  The patient has had persistent aggravating hiccoughs since  admission to the hospital.  It was most likely due to his left lower lobe  pneumonia causing diaphragmatic and phrenic nerve irritation.  We tried him  on baclofen t.i.d. as well as Thorazine 25 mg t.i.d.  Neither of these  medications offered any relief.  He was able to find some relief sitting in  certain positions.  We reassured him the hiccoughs were most likely  secondary to his infection and when his pneumonia cleared hiccoughs would  also abate.  #7 -  THROMBOCYTOPENIA.  At admission the patient's platelet count was at  120,000.  His lowest value during admission was 94,000 and discharge level  was 133,000.  Thrombocytopenia is probably secondary to his chronic disease  and no further evaluation was obtained during this hospitalization.   #8 -  HYPOKALEMIA.  The patient's lowest potassium level during this  hospitalization was 3.1.  On day of discharge we have been supplementing him  with p.o. Kay Ciel 40 mEq p.o. q.i.d.  We checked a magnesium level prior to  discharge and it was within normal range at 2.1.  Supplementation with oral  potassium during hospitalization was probably adequate to replete the  patient's levels as well as his returning to a regular diet when he returns  home.   DISCHARGE LABORATORY RESULTS:  White blood cell count 5.5, hemoglobin 11.4,  platelet count 117.  Sodium 131, potassium 3.1, chloride 96, bicarb 29, BUN  12, creatinine 1.1, glucose 133, calcium 8.0.  Positive blood culture one  out of two Streptococcus viridans.  AFB smears sputum pending.   Sputum Gram  stain and culture were ordered but do not appear in the computer at his  discharge.     Oneal Grout, MS-4                          Alvester Morin, M.D.    MD/MEDQ  D:  08/28/2002  T:  08/30/2002  Job:  [id number]   cc:   Infectious Disease Clinic   Internal Medicine Resident Clinic

## 2011-05-05 ENCOUNTER — Other Ambulatory Visit: Payer: Self-pay | Admitting: Internal Medicine

## 2011-08-13 ENCOUNTER — Other Ambulatory Visit: Payer: Self-pay | Admitting: *Deleted

## 2011-08-13 DIAGNOSIS — I10 Essential (primary) hypertension: Secondary | ICD-10-CM

## 2011-08-13 DIAGNOSIS — B20 Human immunodeficiency virus [HIV] disease: Secondary | ICD-10-CM

## 2011-08-13 MED ORDER — SULFAMETHOXAZOLE-TRIMETHOPRIM 800-160 MG PO TABS: 1.0000 | ORAL_TABLET | Freq: Every day | ORAL | Status: DC

## 2011-08-13 MED ORDER — EFAVIRENZ-EMTRICITAB-TENOFOVIR 600-200-300 MG PO TABS: 1.0000 | ORAL_TABLET | Freq: Every day | ORAL | Status: DC

## 2011-08-13 MED ORDER — AMLODIPINE BESYLATE 10 MG PO TABS: 10.0000 mg | ORAL_TABLET | Freq: Every day | ORAL | Status: DC

## 2011-08-13 NOTE — Telephone Encounter (Signed)
Patient has new insurance and needed new Rx to go to the pharmacy.

## 2011-08-28 ENCOUNTER — Other Ambulatory Visit: Payer: Self-pay | Admitting: *Deleted

## 2011-08-28 DIAGNOSIS — I10 Essential (primary) hypertension: Secondary | ICD-10-CM

## 2011-08-28 DIAGNOSIS — B20 Human immunodeficiency virus [HIV] disease: Secondary | ICD-10-CM

## 2011-08-28 MED ORDER — AMLODIPINE BESYLATE 10 MG PO TABS: 10.0000 mg | ORAL_TABLET | Freq: Every day | ORAL | Status: DC

## 2011-08-28 MED ORDER — SULFAMETHOXAZOLE-TRIMETHOPRIM 800-160 MG PO TABS: 1.0000 | ORAL_TABLET | Freq: Every day | ORAL | Status: DC

## 2011-08-28 MED ORDER — EFAVIRENZ-EMTRICITAB-TENOFOVIR 600-200-300 MG PO TABS: 1.0000 | ORAL_TABLET | Freq: Every day | ORAL | Status: DC

## 2011-08-29 ENCOUNTER — Other Ambulatory Visit: Payer: Self-pay | Admitting: *Deleted

## 2011-08-29 DIAGNOSIS — I10 Essential (primary) hypertension: Secondary | ICD-10-CM

## 2011-08-29 DIAGNOSIS — B20 Human immunodeficiency virus [HIV] disease: Secondary | ICD-10-CM

## 2011-08-29 DIAGNOSIS — B029 Zoster without complications: Secondary | ICD-10-CM

## 2011-08-29 MED ORDER — EFAVIRENZ-EMTRICITAB-TENOFOVIR 600-200-300 MG PO TABS: 1.0000 | ORAL_TABLET | Freq: Every day | ORAL | Status: DC

## 2011-08-29 MED ORDER — AMLODIPINE BESYLATE 10 MG PO TABS: 10.0000 mg | ORAL_TABLET | Freq: Every day | ORAL | Status: DC

## 2011-08-29 MED ORDER — SULFAMETHOXAZOLE-TRIMETHOPRIM 800-160 MG PO TABS: 1.0000 | ORAL_TABLET | Freq: Every day | ORAL | Status: DC

## 2011-08-29 MED ORDER — TRIAMCINOLONE ACETONIDE 0.1 % EX CREA: 1.0000 "application " | TOPICAL_CREAM | Freq: Two times a day (BID) | CUTANEOUS | Status: DC

## 2011-10-03 IMAGING — CT CT CHEST W/ CM
2 of 3 series · 15 of 36 positions shown, 18 images · IV contrast (APPLIED)
Comparison: Chest x-ray of 11/21/2009

CLINICAL DATA: Cough, shortness of breath, d muscle aches for 2
weeks, questionable abnormality on chest x-ray

CT CHEST WITH CONTRAST
TECHNIQUE: Multidetector CT imaging of the chest was performed
following the standard protocol during bolus administration of
intravenous contrast.
Contrast: 80 ml [id number]-QVV

[Series 2: routine chest 5.0 st · axial · 0.76mm/px · z∈[+932,+1212]mm · 12 of 66 slices shown, 15 images]
[im 5/66  mediastinal]
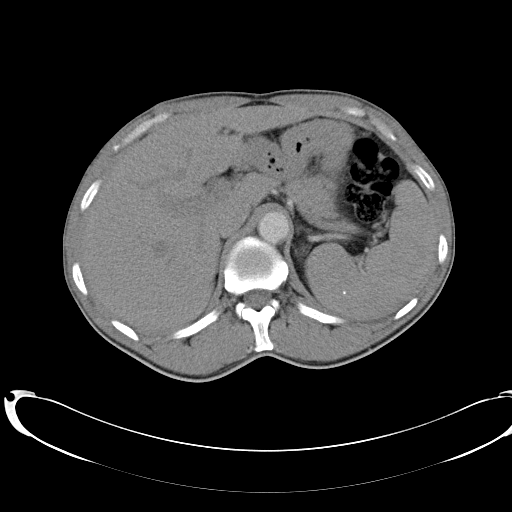
[im 5/66  lung]
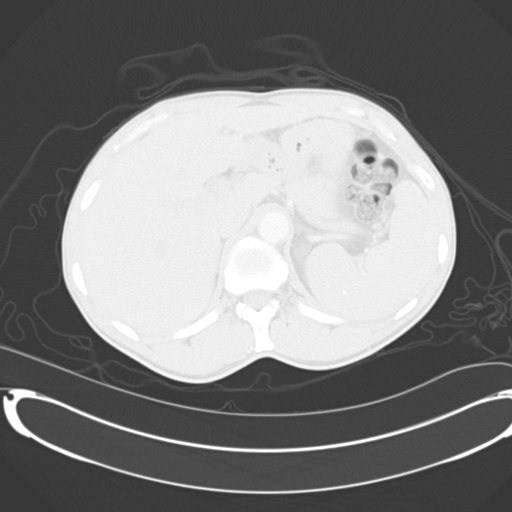
[im 10/66  lung]
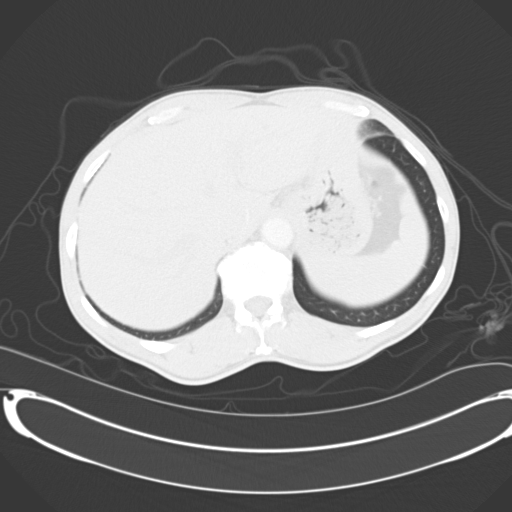
[im 15/66  lung]
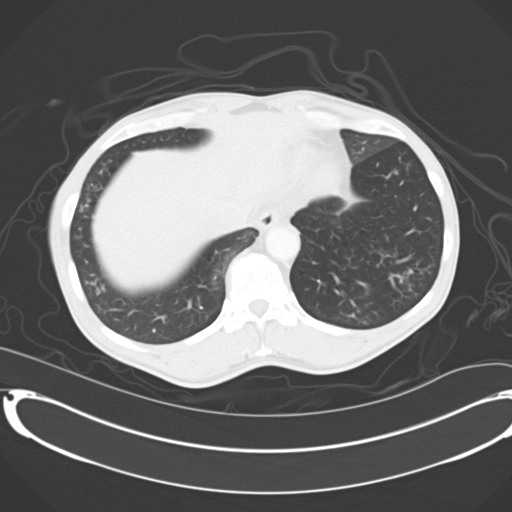
[im 20/66  lung]
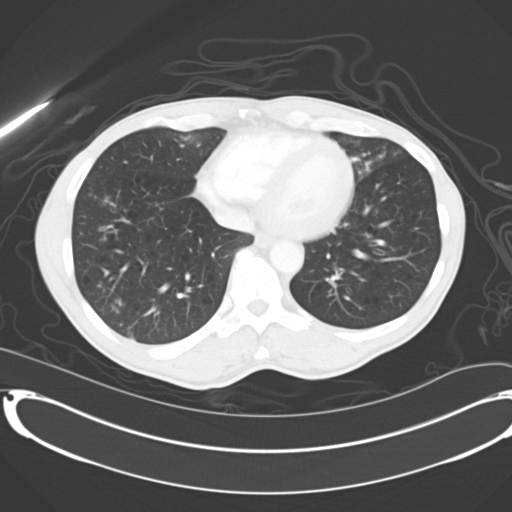
[im 25/66  mediastinal]
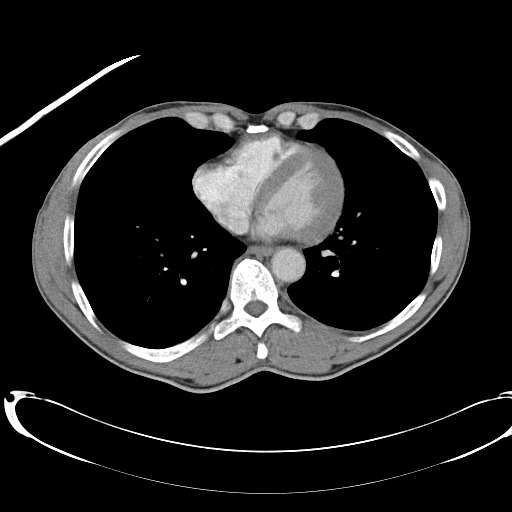
[im 25/66  lung]
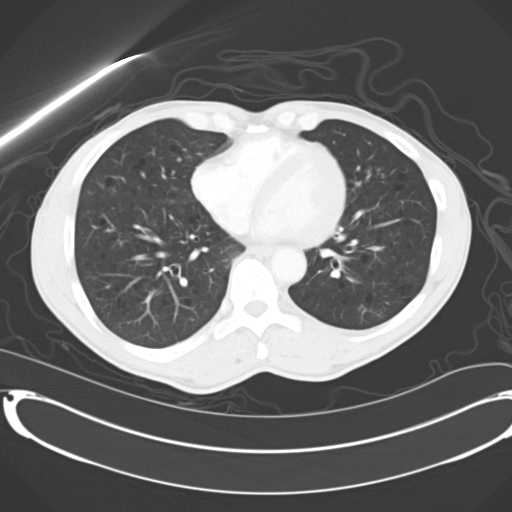
[im 29/66  lung]
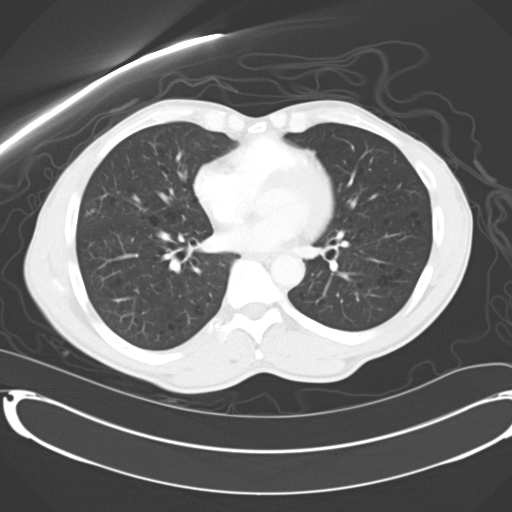
[im 37/66  lung]
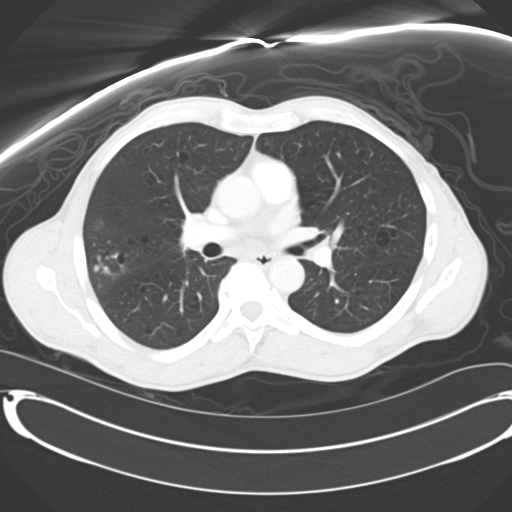
[im 41/66  lung]
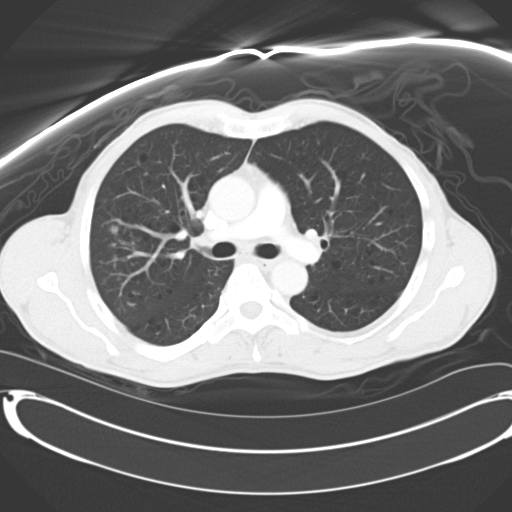
[im 46/66  mediastinal]
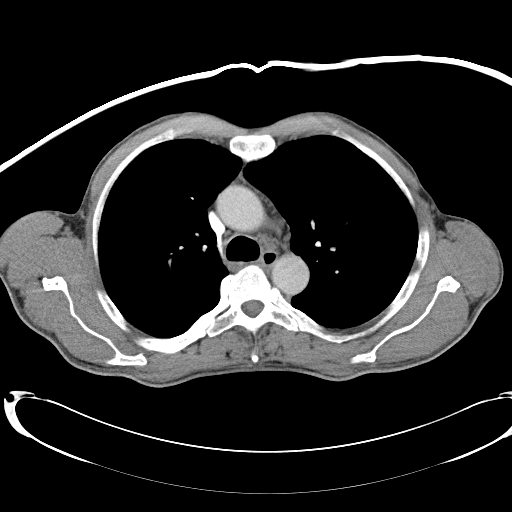
[im 46/66  lung]
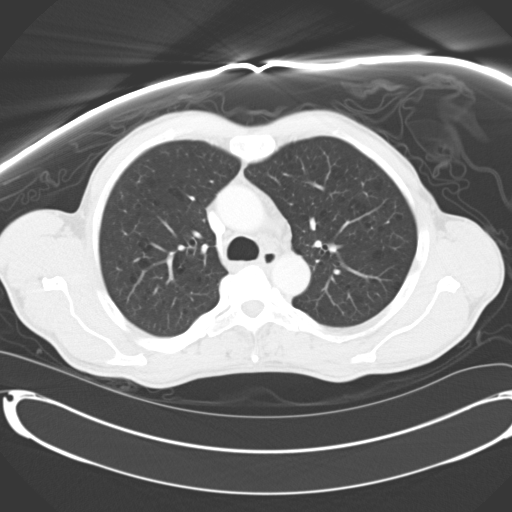
[im 51/66  lung]
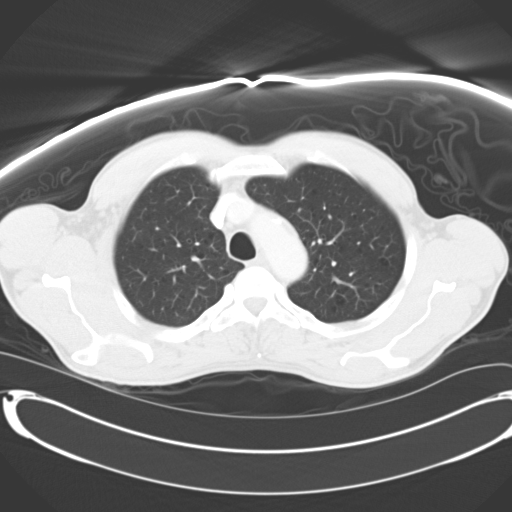
[im 56/66  lung]
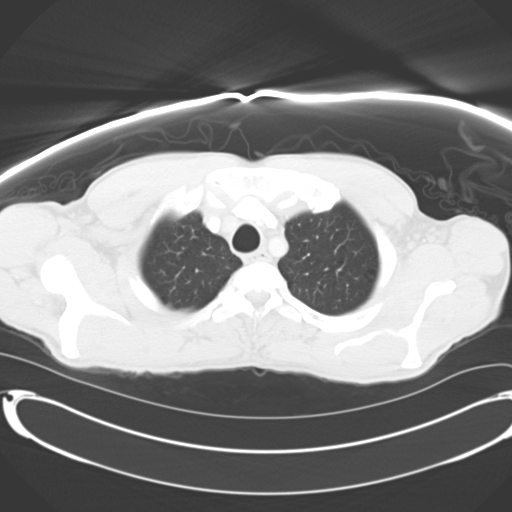
[im 61/66  lung]
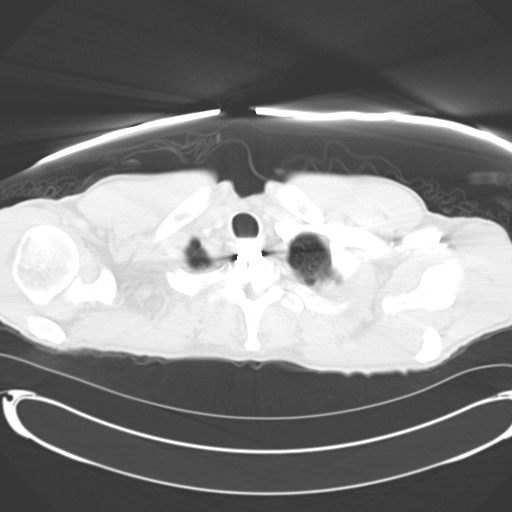

[Series 5: routine chest 3.0 st · coronal · 0.80mm/px · 3 of 65 slices shown]
[im 13/65  lung]
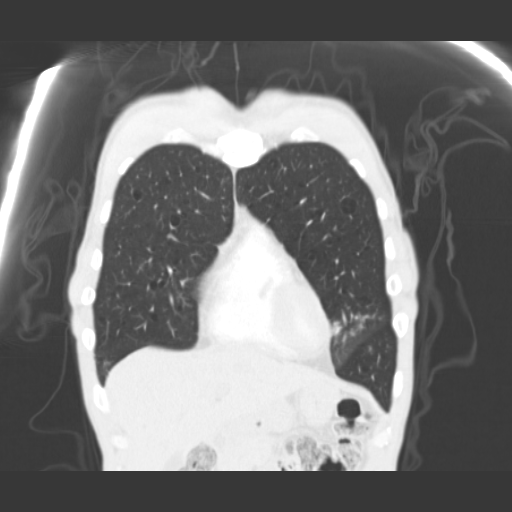
[im 26/65  lung]
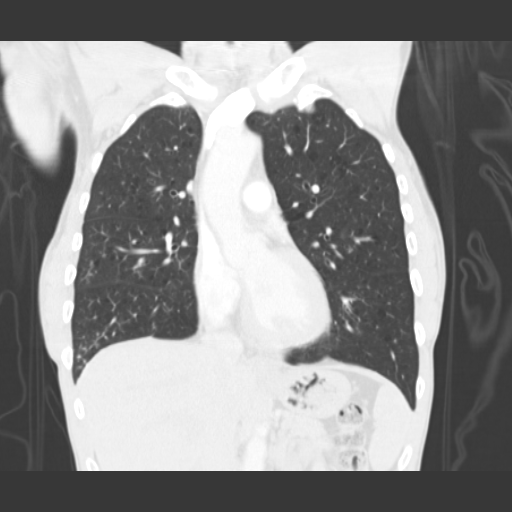
[im 39/65  lung]
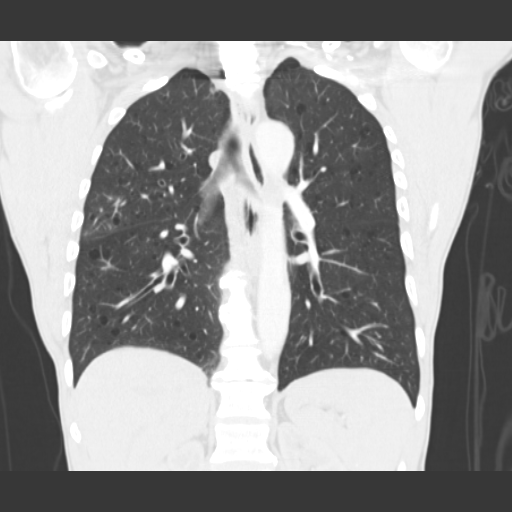

[15 of 36 positions shown; findings below may reference images not displayed]

FINDINGS: There is patchy opacity within the periphery of the
inferior right upper lobe with "tree in bud" appearance.  To a
lesser degree there is also some involvement peripherally in the
right middle lobe, and both lower lobes as well as the lingula.
These findings are nonspecific but suggest peripheral mucous
plugging and pain bronchiolitis which can be seen with
Mycobacterium, viral, fungal,
immunological disorders, as well as aspiration. The remainder of
the lungs are clear although there are small blebs within the right
upper lobe near the area of tree in bud opacification which may
explain the question of cavitation.  No pleural effusion is seen.

On soft tissue window images, the pulmonary arteries opacify with
no significant abnormality noted.  The thoracic aorta is not as
well opacified but no acute abnormality is seen.  No mediastinal or
hilar adenopathy is noted.  What is seen of the upper abdomen is
normal, with several small calcified splenic granulomas present.
IMPRESSION: Tree in bud the opacity within the periphery of the right upper
lobe as well as to a lesser degree in the lower lobes, right middle
lobe, and lingula suggestive of panbronchiolitis.  This can be seen
with Mycobacterium infections, viral, fungal, immuno logic
disorders, as well as aspiration.

## 2012-03-04 ENCOUNTER — Other Ambulatory Visit: Payer: Self-pay | Admitting: Infectious Diseases

## 2012-03-12 ENCOUNTER — Telehealth: Payer: Self-pay | Admitting: *Deleted

## 2012-03-12 NOTE — Telephone Encounter (Signed)
Pt is working on a [employer].  Had recent urine drug screen which came back positive.  Pt requesting letter stating that the Atripla will show up positive on a urine drug screen.  Pt wants letter sent to A. Idolina Primer.  Claudette Laws would like a phone call when the letter is being mailed.

## 2012-03-13 ENCOUNTER — Encounter: Payer: Self-pay | Admitting: Internal Medicine

## 2012-03-13 NOTE — Telephone Encounter (Signed)
I completed a letter for him.

## 2012-08-18 ENCOUNTER — Other Ambulatory Visit: Payer: Self-pay | Admitting: *Deleted

## 2012-08-18 DIAGNOSIS — B20 Human immunodeficiency virus [HIV] disease: Secondary | ICD-10-CM

## 2012-08-18 MED ORDER — EFAVIRENZ-EMTRICITAB-TENOFOVIR 600-200-300 MG PO TABS: 1.0000 | ORAL_TABLET | Freq: Every day | ORAL | Status: DC

## 2012-09-12 ENCOUNTER — Other Ambulatory Visit: Payer: 59

## 2012-09-12 DIAGNOSIS — Z79899 Other long term (current) drug therapy: Secondary | ICD-10-CM

## 2012-09-12 DIAGNOSIS — Z113 Encounter for screening for infections with a predominantly sexual mode of transmission: Secondary | ICD-10-CM

## 2012-09-12 DIAGNOSIS — B20 Human immunodeficiency virus [HIV] disease: Secondary | ICD-10-CM

## 2012-09-12 LAB — CBC WITH DIFFERENTIAL/PLATELET
Eosinophils Relative: 3 % (ref 0–5)
Lymphocytes Relative: 25 % (ref 12–46)
Lymphs Abs: 1.3 10*3/uL (ref 0.7–4.0)
MCV: 89.6 fL (ref 78.0–100.0)
Neutrophils Relative %: 59 % (ref 43–77)
Platelets: 182 10*3/uL (ref 150–400)
RBC: 5.28 MIL/uL (ref 4.22–5.81)
WBC: 5.3 10*3/uL (ref 4.0–10.5)

## 2012-09-12 LAB — RPR

## 2012-09-13 LAB — COMPREHENSIVE METABOLIC PANEL
AST: 21 U/L (ref 0–37)
CO2: 23 mEq/L (ref 19–32)
Chloride: 105 mEq/L (ref 96–112)
Sodium: 143 mEq/L (ref 135–145)

## 2012-09-13 LAB — LIPID PANEL
HDL: 47 mg/dL (ref >39)
LDL Cholesterol: 68 mg/dL (ref 0–99)
Total CHOL/HDL Ratio: 2.7 Ratio
Triglycerides: 66 mg/dL (ref <150)
VLDL: 13 mg/dL (ref 0–40)

## 2012-09-25 ENCOUNTER — Encounter: Payer: Self-pay | Admitting: Infectious Diseases

## 2012-09-25 ENCOUNTER — Ambulatory Visit (INDEPENDENT_AMBULATORY_CARE_PROVIDER_SITE_OTHER): Payer: BC Managed Care – PPO | Admitting: Infectious Diseases

## 2012-09-25 VITALS — BP 182/103 | HR 60 | Temp 97.8°F | Wt 140.0 lb

## 2012-09-25 DIAGNOSIS — F17200 Nicotine dependence, unspecified, uncomplicated: Secondary | ICD-10-CM

## 2012-09-25 DIAGNOSIS — Z113 Encounter for screening for infections with a predominantly sexual mode of transmission: Secondary | ICD-10-CM

## 2012-09-25 DIAGNOSIS — B20 Human immunodeficiency virus [HIV] disease: Secondary | ICD-10-CM

## 2012-09-25 DIAGNOSIS — Z79899 Other long term (current) drug therapy: Secondary | ICD-10-CM

## 2012-09-25 DIAGNOSIS — I10 Essential (primary) hypertension: Secondary | ICD-10-CM

## 2012-09-25 MED ORDER — AMLODIPINE BESYLATE 10 MG PO TABS: 10.0000 mg | ORAL_TABLET | Freq: Every day | ORAL | Status: DC

## 2012-09-25 MED ORDER — ELVITEG-COBIC-EMTRICIT-TENOFDF 150-150-200-300 MG PO TABS: 1.0000 | ORAL_TABLET | Freq: Every day | ORAL | Status: DC

## 2012-09-25 MED ORDER — BUPROPION HCL ER (SR) 150 MG PO TB12: 150.0000 mg | ORAL_TABLET | Freq: Two times a day (BID) | ORAL | Status: DC

## 2012-09-25 NOTE — Assessment & Plan Note (Signed)
Will refill his norvasc. Needs to get repeat BP in next month.

## 2012-09-25 NOTE — Assessment & Plan Note (Addendum)
Wants to change atripla due to positive drug tests. Will change him to stribilid (some concern about his low CD4 to give him complera).. Refuses flu shot. Offered colonoscopy. No need for further bactrim. Recheck his Cr in 4-6 weeks. Otherwise o/v in 3-4 months.

## 2012-09-25 NOTE — Assessment & Plan Note (Addendum)
Encouraged to quit, pt would like cessation aide. Will try wellbutrin.

## 2012-09-25 NOTE — Progress Notes (Signed)
  Subjective:    Patient ID: Noah Perez, male    DOB: Aug 24, 1951, 61 y.o.   MRN: [id number]  HPI 61 yo M with hx of HIV+ since 1996. He has been taking atripla, has not been seen in clinic in last 2 years. States he has been feeling well except for pneumonia last year.  Has run out of bp rx.   HIV 1 RNA Quant (copies/mL)  Date Value  09/12/2012 <20   01/26/2011 82*  12/29/2010 14600*     CD4 T Cell Abs (cmm)  Date Value  09/12/2012 380*  01/26/2011 270*  12/08/2010 280*      Review of Systems  Constitutional: Negative for appetite change and unexpected weight change.  Cardiovascular: Negative for chest pain.  Gastrointestinal: Negative for diarrhea and constipation.  Genitourinary: Negative for difficulty urinating.  Neurological: Positive for headaches.       Headaches from "not eating proper"       Objective:   Physical Exam  Constitutional: He appears well-developed and well-nourished.  HENT:  Mouth/Throat: No oropharyngeal exudate.  Eyes: EOM are normal. Pupils are equal, round, and reactive to light.  Neck: Neck supple.  Cardiovascular: Normal rate, regular rhythm and normal heart sounds.   Pulmonary/Chest: Effort normal and breath sounds normal.  Abdominal: Soft. Bowel sounds are normal. There is no tenderness.  Lymphadenopathy:    He has no cervical adenopathy.          Assessment & Plan:

## 2012-10-24 NOTE — Addendum Note (Signed)
Addended by: Jennet Maduro D on: 10/24/2012 10:28 AM   Modules accepted: Orders

## 2012-10-28 ENCOUNTER — Telehealth: Payer: Self-pay | Admitting: Licensed Clinical Social Worker

## 2012-10-28 ENCOUNTER — Other Ambulatory Visit: Payer: Self-pay | Admitting: Licensed Clinical Social Worker

## 2012-10-28 DIAGNOSIS — I10 Essential (primary) hypertension: Secondary | ICD-10-CM

## 2012-10-28 DIAGNOSIS — F17200 Nicotine dependence, unspecified, uncomplicated: Secondary | ICD-10-CM

## 2012-10-28 MED ORDER — BUPROPION HCL ER (SR) 150 MG PO TB12: 150.0000 mg | ORAL_TABLET | Freq: Two times a day (BID) | ORAL | Status: DC

## 2012-10-28 MED ORDER — AMLODIPINE BESYLATE 10 MG PO TABS: 10.0000 mg | ORAL_TABLET | Freq: Every day | ORAL | Status: DC

## 2012-11-07 ENCOUNTER — Other Ambulatory Visit: Payer: BC Managed Care – PPO

## 2012-11-07 NOTE — Telephone Encounter (Signed)
Ok

## 2012-11-10 ENCOUNTER — Encounter (HOSPITAL_COMMUNITY): Payer: Self-pay | Admitting: Emergency Medicine

## 2012-11-10 ENCOUNTER — Telehealth: Payer: Self-pay | Admitting: *Deleted

## 2012-11-10 ENCOUNTER — Emergency Department (HOSPITAL_COMMUNITY)
Admission: EM | Admit: 2012-11-10 | Discharge: 2012-11-10 | Disposition: A | Discharge status: Home / Self Care | Payer: BC Managed Care – PPO | Source: Home / Self Care | Attending: Emergency Medicine | Admitting: Emergency Medicine

## 2012-11-10 ENCOUNTER — Emergency Department (INDEPENDENT_AMBULATORY_CARE_PROVIDER_SITE_OTHER): Payer: BC Managed Care – PPO

## 2012-11-10 DIAGNOSIS — Z21 Asymptomatic human immunodeficiency virus [HIV] infection status: Secondary | ICD-10-CM

## 2012-11-10 DIAGNOSIS — J069 Acute upper respiratory infection, unspecified: Secondary | ICD-10-CM

## 2012-11-10 HISTORY — DX: Asymptomatic human immunodeficiency virus (hiv) infection status: Z21

## 2012-11-10 HISTORY — DX: Human immunodeficiency virus (HIV) disease: B20

## 2012-11-10 LAB — POCT RAPID STREP A: Streptococcus, Group A Screen (Direct): NEGATIVE

## 2012-11-10 MED ORDER — AMOXICILLIN-POT CLAVULANATE 875-125 MG PO TABS: 1.0000 | ORAL_TABLET | Freq: Two times a day (BID) | ORAL | Status: DC

## 2012-11-10 NOTE — ED Provider Notes (Signed)
Medical screening examination/treatment/procedure(s) were performed by non-physician practitioner and as supervising physician I was immediately available for consultation/collaboration.  Leslee Home, M.D.   Reuben Likes, MD 11/10/12 [phone]

## 2012-11-10 NOTE — ED Provider Notes (Signed)
History     CSN: [id number]  Arrival date & time 11/10/12  1359   First MD Initiated Contact with Patient 11/10/12 1550      Chief Complaint  Patient presents with  . URI    (Consider location/radiation/quality/duration/timing/severity/associated sxs/prior treatment) Patient is a 62 y.o. male presenting with URI. The history is provided by the patient.  URI The primary symptoms include fatigue, sore throat, cough and myalgias. The current episode started more than 1 week ago. This is a new problem.  Symptoms associated with the illness include chills, congestion and rhinorrhea. Risk factors for severe complications from URI include immunosuppression.    Past Medical History  Diagnosis Date  . Hypertension   . HIV (human immunodeficiency virus infection)     History reviewed. No pertinent past surgical history.  Family History  Problem Relation Age of Onset  . Alcohol abuse Father     History  Substance Use Topics  . Smoking status: Current Every Day Smoker -- 0.5 packs/day    Types: Cigarettes  . Smokeless tobacco: Never Used  . Alcohol Use: 0.5 oz/week    1 drink(s) per week      Review of Systems  Constitutional: Positive for chills and fatigue.  HENT: Positive for congestion, sore throat and rhinorrhea.   Respiratory: Positive for cough and shortness of breath.   Musculoskeletal: Positive for myalgias.  All other systems reviewed and are negative.    Allergies  Oxycodone-acetaminophen  Home Medications   Current Outpatient Rx  Name  Route  Sig  Dispense  Refill  . AMLODIPINE BESYLATE 10 MG PO TABS   Oral   Take 1 tablet (10 mg total) by mouth daily.   30 tablet   2   . BUPROPION HCL ER (SR) 150 MG PO TB12   Oral   Take 1 tablet (150 mg total) by mouth 2 (two) times daily.   60 tablet   3   . ELVITEG-COBICIS-EMTRICIT-TENOF 150-150-200-300 MG PO TABS   Oral   Take 1 tablet by mouth daily with breakfast.   30 tablet   6   . AMOXICILLIN-POT  CLAVULANATE 875-125 MG PO TABS   Oral   Take 1 tablet by mouth every 12 (twelve) hours.   20 tablet   0   . TRIAMCINOLONE ACETONIDE 0.1 % EX CREA   Topical   Apply 1 application topically 2 (two) times daily. Use sparingly   30 g   2     BP 131/84  Pulse 76  Temp 97.8 F (36.6 C) (Oral)  Resp 18  SpO2 100%  Physical Exam  Nursing note and vitals reviewed. Constitutional: He is oriented to person, place, and time. Vital signs are normal. He appears well-developed and well-nourished. He is active and cooperative.  HENT:  Head: Normocephalic.  Right Ear: External ear normal.  Left Ear: External ear normal.  Nose: Nose normal.  Mouth/Throat: Uvula is midline and mucous membranes are normal. Posterior oropharyngeal erythema present. No oropharyngeal exudate.  Eyes: Conjunctivae normal are normal. Pupils are equal, round, and reactive to light. No scleral icterus.  Neck: Trachea normal and normal range of motion. Neck supple.  Cardiovascular: Normal rate, regular rhythm, normal heart sounds, intact distal pulses and normal pulses.   Pulmonary/Chest: Effort normal and breath sounds normal.  Lymphadenopathy:       Head (right side): No submental, no submandibular, no tonsillar, no preauricular, no posterior auricular and no occipital adenopathy present.  Head (left side): No submental, no submandibular, no tonsillar, no preauricular, no posterior auricular and no occipital adenopathy present.    He has no cervical adenopathy.    He has no axillary adenopathy.  Neurological: He is alert and oriented to person, place, and time. No cranial nerve deficit or sensory deficit.  Skin: Skin is warm, dry and intact.  Psychiatric: He has a normal mood and affect. His speech is normal and behavior is normal. Judgment and thought content normal. Cognition and memory are normal.    ED Course  Procedures (including critical care time)   Labs Reviewed  POCT RAPID STREP A ([facility]  ONLY)   Dg Chest 2 View  11/10/2012  *RADIOLOGY REPORT*  Clinical Data: Cough.  Low grade fever.  Night sweats.  Bodyaches. Smoker.  CHEST - 2 VIEW  Comparison: 12/07/2010 plain film and CT.  Findings: Mild underlying hyperinflation.  Lower cervical spine fixation.  Midline trachea.  Normal heart size and mediastinal contours. No pleural effusion or pneumothorax.  Clear lungs.  IMPRESSION: No acute cardiopulmonary disease.   Original Report Authenticated By: Jeronimo Greaves, M.D.      1. URI (upper respiratory infection)       MDM  Increase fluids, antihistamine of your choice, antibiotics as prescribed.          Johnsie Kindred, NP 11/10/12 1650

## 2012-11-10 NOTE — ED Notes (Signed)
Pt c/o cold since 10/31/12.  Sx include: runny nose, dry cough, sore throat, dyspahgia Denies: fevers, vomiting, nauseas, diarrhea He is alert w/no signs of acute distress Hx of pneumonia and smokes 1/2 PPD.

## 2012-11-10 NOTE — Telephone Encounter (Signed)
Patient's girlfriend called requesting an appt, he has been having nausea and sob x 2 days.  Offered appt for tomorrow with Dr. Drue Second at 8:45 AM, first available, she said he couldn't wait and would go to the ED. Advised for him to be evaluated at Urgent Care. Wendall Mola CMA

## 2012-11-21 ENCOUNTER — Other Ambulatory Visit (INDEPENDENT_AMBULATORY_CARE_PROVIDER_SITE_OTHER): Payer: BC Managed Care – PPO

## 2012-11-21 ENCOUNTER — Other Ambulatory Visit: Payer: Self-pay | Admitting: Infectious Diseases

## 2012-11-21 ENCOUNTER — Ambulatory Visit: Payer: BC Managed Care – PPO | Admitting: *Deleted

## 2012-11-21 VITALS — BP 143/87 | HR 73 | Ht 71.0 in | Wt 140.0 lb

## 2012-11-21 DIAGNOSIS — B20 Human immunodeficiency virus [HIV] disease: Secondary | ICD-10-CM

## 2012-11-21 DIAGNOSIS — Z21 Asymptomatic human immunodeficiency virus [HIV] infection status: Secondary | ICD-10-CM

## 2012-11-21 DIAGNOSIS — I10 Essential (primary) hypertension: Secondary | ICD-10-CM

## 2012-11-21 LAB — CBC WITH DIFFERENTIAL/PLATELET
Basophils Absolute: 0 10*3/uL (ref 0.0–0.1)
Basophils Relative: 1 % (ref 0–1)
Eosinophils Absolute: 0.2 10*3/uL (ref 0.0–0.7)
Eosinophils Relative: 2 % (ref 0–5)
MCH: 31 pg (ref 26.0–34.0)
MCHC: 34.9 g/dL (ref 30.0–36.0)
MCV: 88.7 fL (ref 78.0–100.0)
Neutrophils Relative %: 67 % (ref 43–77)
Platelets: 330 10*3/uL (ref 150–400)
RBC: 4.71 MIL/uL (ref 4.22–5.81)
RDW: 13.8 % (ref 11.5–15.5)

## 2012-11-21 LAB — COMPLETE METABOLIC PANEL WITH GFR
ALT: 30 U/L (ref 0–53)
Alkaline Phosphatase: 81 U/L (ref 39–117)
Creat: 1.06 mg/dL (ref 0.50–1.35)
GFR, Est African American: 87 mL/min
Sodium: 141 mEq/L (ref 135–145)
Total Bilirubin: 0.7 mg/dL (ref 0.3–1.2)
Total Protein: 7 g/dL (ref 6.0–8.3)

## 2012-11-21 LAB — T-HELPER CELL (CD4) - (RCID CLINIC ONLY)
CD4 % Helper T Cell: 31 % — ABNORMAL LOW (ref 33–55)
CD4 T Cell Abs: 500 uL (ref 400–2700)

## 2012-11-23 LAB — HIV-1 RNA QUANT-NO REFLEX-BLD: HIV-1 RNA Quant, Log: <1.3 {Log} (ref <1.30)

## 2012-12-08 ENCOUNTER — Ambulatory Visit: Payer: BC Managed Care – PPO | Admitting: Infectious Diseases

## 2013-01-26 ENCOUNTER — Ambulatory Visit: Payer: BC Managed Care – PPO | Admitting: Infectious Diseases

## 2013-02-09 ENCOUNTER — Encounter: Payer: Self-pay | Admitting: Infectious Diseases

## 2013-02-09 ENCOUNTER — Ambulatory Visit (INDEPENDENT_AMBULATORY_CARE_PROVIDER_SITE_OTHER): Payer: BC Managed Care – PPO | Admitting: Infectious Diseases

## 2013-02-09 VITALS — BP 149/83 | HR 68 | Temp 97.1°F | Ht 71.0 in | Wt 144.0 lb

## 2013-02-09 DIAGNOSIS — Z113 Encounter for screening for infections with a predominantly sexual mode of transmission: Secondary | ICD-10-CM

## 2013-02-09 DIAGNOSIS — F17200 Nicotine dependence, unspecified, uncomplicated: Secondary | ICD-10-CM

## 2013-02-09 DIAGNOSIS — B20 Human immunodeficiency virus [HIV] disease: Secondary | ICD-10-CM

## 2013-02-09 DIAGNOSIS — Z79899 Other long term (current) drug therapy: Secondary | ICD-10-CM

## 2013-02-09 DIAGNOSIS — B029 Zoster without complications: Secondary | ICD-10-CM

## 2013-02-09 DIAGNOSIS — J984 Other disorders of lung: Secondary | ICD-10-CM

## 2013-02-09 DIAGNOSIS — I10 Essential (primary) hypertension: Secondary | ICD-10-CM

## 2013-02-09 MED ORDER — TRIAMCINOLONE ACETONIDE 0.1 % EX LOTN: TOPICAL_LOTION | Freq: Three times a day (TID) | CUTANEOUS | Status: DC

## 2013-02-09 MED ORDER — AMLODIPINE BESYLATE 10 MG PO TABS: 10.0000 mg | ORAL_TABLET | Freq: Every day | ORAL | Status: DC

## 2013-02-09 NOTE — Assessment & Plan Note (Signed)
This is not noted on his CT scan 2011. Will repeat this to see if resolved.

## 2013-02-09 NOTE — Assessment & Plan Note (Signed)
He is doing very well. I offered to set him up for colonoscopy but he defers. He is also offered/refuses condoms. He will cont his current meds, needs rpr, cholesterol with next labs. See him back in 6 months.

## 2013-02-09 NOTE — Assessment & Plan Note (Signed)
Encouraged him to quit. He is trying.

## 2013-02-09 NOTE — Progress Notes (Signed)
  Subjective:    Patient ID: Noah Perez, male    DOB: 29-Jul-1951, 62 y.o.   MRN: [id number]  HPI  62 yo M with hx of HIV+ since 1996. He has been taking atripla until Nov 2013 when he was changed to stribild. Has not had problems with this.  He was also started on Wellbutrin to improve his smoking. He has continued to smoke, feels like he has cut back.  No hx of colonoscopy.   HIV 1 RNA Quant (copies/mL)  Date Value  11/21/2012 <20   09/12/2012 <20   01/26/2011 82*     CD4 T Cell Abs (cmm)  Date Value  11/21/2012 500   09/12/2012 380*  01/26/2011 270*      Review of Systems  Constitutional: Negative for appetite change and unexpected weight change.  Respiratory: Negative for shortness of breath.   Cardiovascular: Negative for chest pain.  Gastrointestinal: Negative for diarrhea and constipation.  Genitourinary: Negative for difficulty urinating.       Objective:   Physical Exam  Constitutional: He appears well-developed and well-nourished.  HENT:  Mouth/Throat: No oropharyngeal exudate.  Eyes: EOM are normal. Pupils are equal, round, and reactive to light.  Neck: Neck supple.  Cardiovascular: Normal rate, regular rhythm and normal heart sounds.   Pulmonary/Chest: Effort normal and breath sounds normal.  Abdominal: Soft. Bowel sounds are normal. There is no tenderness. There is no rebound.  Lymphadenopathy:    He has no cervical adenopathy.          Assessment & Plan:

## 2013-02-09 NOTE — Assessment & Plan Note (Signed)
Mildly elevated today. Will refill his norvasc. He is asx.

## 2013-02-19 ENCOUNTER — Telehealth: Payer: Self-pay | Admitting: *Deleted

## 2013-02-19 ENCOUNTER — Other Ambulatory Visit: Payer: Self-pay | Admitting: Infectious Diseases

## 2013-02-19 DIAGNOSIS — R911 Solitary pulmonary nodule: Secondary | ICD-10-CM

## 2013-02-19 NOTE — Telephone Encounter (Signed)
Called patient and notified of CT scan scheduled for 02/27/13 at 9:00 AM at [facility] radiology. Noah Perez

## 2013-02-24 ENCOUNTER — Telehealth: Payer: Self-pay | Admitting: *Deleted

## 2013-02-24 MED ORDER — TRIAMCINOLONE ACETONIDE 0.025 % EX OINT: 1.0000 "application " | TOPICAL_OINTMENT | Freq: Three times a day (TID) | CUTANEOUS | Status: DC

## 2013-02-24 NOTE — Telephone Encounter (Signed)
Requesting ointment preparation rather than lotion.

## 2013-02-27 ENCOUNTER — Ambulatory Visit (HOSPITAL_COMMUNITY)
Admission: RE | Admit: 2013-02-27 | Discharge: 2013-02-27 | Disposition: A | Discharge status: Home / Self Care | Payer: BC Managed Care – PPO | Source: Ambulatory Visit | Attending: Infectious Diseases | Admitting: Infectious Diseases

## 2013-02-27 ENCOUNTER — Other Ambulatory Visit: Payer: Self-pay | Admitting: *Deleted

## 2013-02-27 DIAGNOSIS — Z21 Asymptomatic human immunodeficiency virus [HIV] infection status: Secondary | ICD-10-CM | POA: Insufficient documentation

## 2013-02-27 DIAGNOSIS — J438 Other emphysema: Secondary | ICD-10-CM | POA: Insufficient documentation

## 2013-02-27 DIAGNOSIS — I10 Essential (primary) hypertension: Secondary | ICD-10-CM

## 2013-02-27 DIAGNOSIS — F17200 Nicotine dependence, unspecified, uncomplicated: Secondary | ICD-10-CM | POA: Insufficient documentation

## 2013-02-27 DIAGNOSIS — R911 Solitary pulmonary nodule: Secondary | ICD-10-CM | POA: Insufficient documentation

## 2013-02-27 MED ORDER — AMLODIPINE BESYLATE 10 MG PO TABS: 10.0000 mg | ORAL_TABLET | Freq: Every day | ORAL | Status: DC

## 2013-02-27 MED ORDER — IOHEXOL 300 MG/ML  SOLN
80.0000 mL | Freq: Once | INTRAMUSCULAR | Status: AC | PRN
  Administered 2013-02-27: 80 mL via INTRAVENOUS

## 2013-03-12 ENCOUNTER — Telehealth: Payer: Self-pay | Admitting: *Deleted

## 2013-03-12 NOTE — Telephone Encounter (Signed)
RN reviewed results with the pt.  Pt pleased with information.  RN advised pt to watch his sodium intake due to his blood pressure problem and to watch cholesterol/fat intake.  Pt to schedule f/u appt for October per Dr. Moshe Cipro note.  Pt verbalized understanding of advice.

## 2013-03-27 ENCOUNTER — Other Ambulatory Visit: Payer: Self-pay | Admitting: *Deleted

## 2013-03-27 DIAGNOSIS — I10 Essential (primary) hypertension: Secondary | ICD-10-CM

## 2013-03-27 MED ORDER — AMLODIPINE BESYLATE 10 MG PO TABS: 10.0000 mg | ORAL_TABLET | Freq: Every day | ORAL | Status: DC

## 2013-04-20 ENCOUNTER — Telehealth: Payer: Self-pay | Admitting: *Deleted

## 2013-04-20 NOTE — Telephone Encounter (Signed)
Advised that pt call [facility] to talk with P. Jones about programs to help with paying for medications.  Pt to be given the message to call.

## 2013-05-11 ENCOUNTER — Other Ambulatory Visit: Payer: Self-pay | Admitting: Infectious Diseases

## 2013-09-10 ENCOUNTER — Other Ambulatory Visit: Payer: Self-pay

## 2013-10-13 ENCOUNTER — Other Ambulatory Visit: Payer: BC Managed Care – PPO

## 2013-10-13 DIAGNOSIS — Z113 Encounter for screening for infections with a predominantly sexual mode of transmission: Secondary | ICD-10-CM

## 2013-10-13 DIAGNOSIS — Z79899 Other long term (current) drug therapy: Secondary | ICD-10-CM

## 2013-10-13 DIAGNOSIS — B20 Human immunodeficiency virus [HIV] disease: Secondary | ICD-10-CM

## 2013-10-13 LAB — CBC
HCT: 41.1 % (ref 39.0–52.0)
MCHC: 36.3 g/dL — ABNORMAL HIGH (ref 30.0–36.0)
MCV: 86.3 fL (ref 78.0–100.0)
Platelets: 205 10*3/uL (ref 150–400)
RDW: 14 % (ref 11.5–15.5)
WBC: 6.3 10*3/uL (ref 4.0–10.5)

## 2013-10-14 LAB — COMPREHENSIVE METABOLIC PANEL
ALT: 14 U/L (ref 0–53)
AST: 23 U/L (ref 0–37)
Alkaline Phosphatase: 97 U/L (ref 39–117)
BUN: 20 mg/dL (ref 6–23)
Chloride: 106 mEq/L (ref 96–112)
Creat: 1.18 mg/dL (ref 0.50–1.35)
Potassium: 4.1 mEq/L (ref 3.5–5.3)

## 2013-10-14 LAB — RPR

## 2013-10-14 LAB — LIPID PANEL
HDL: 46 mg/dL (ref >39)
LDL Cholesterol: 66 mg/dL (ref 0–99)
Total CHOL/HDL Ratio: 3.1 Ratio

## 2013-10-15 LAB — T-HELPER CELL (CD4) - (RCID CLINIC ONLY): CD4 T Cell Abs: 610 /uL (ref 400–2700)

## 2013-10-15 LAB — HIV-1 RNA QUANT-NO REFLEX-BLD: HIV-1 RNA Quant, Log: <1.3 {Log} (ref <1.30)

## 2013-11-16 ENCOUNTER — Ambulatory Visit (INDEPENDENT_AMBULATORY_CARE_PROVIDER_SITE_OTHER): Payer: BC Managed Care – PPO | Admitting: Infectious Diseases

## 2013-11-16 ENCOUNTER — Encounter: Payer: Self-pay | Admitting: Infectious Diseases

## 2013-11-16 VITALS — BP 143/88 | HR 66 | Temp 98.2°F | Ht 71.0 in | Wt 147.0 lb

## 2013-11-16 DIAGNOSIS — B20 Human immunodeficiency virus [HIV] disease: Secondary | ICD-10-CM

## 2013-11-16 DIAGNOSIS — J449 Chronic obstructive pulmonary disease, unspecified: Secondary | ICD-10-CM

## 2013-11-16 DIAGNOSIS — Z113 Encounter for screening for infections with a predominantly sexual mode of transmission: Secondary | ICD-10-CM

## 2013-11-16 DIAGNOSIS — J984 Other disorders of lung: Secondary | ICD-10-CM

## 2013-11-16 DIAGNOSIS — Z79899 Other long term (current) drug therapy: Secondary | ICD-10-CM

## 2013-11-16 DIAGNOSIS — I10 Essential (primary) hypertension: Secondary | ICD-10-CM

## 2013-11-16 DIAGNOSIS — F17200 Nicotine dependence, unspecified, uncomplicated: Secondary | ICD-10-CM

## 2013-11-16 DIAGNOSIS — B351 Tinea unguium: Secondary | ICD-10-CM | POA: Insufficient documentation

## 2013-11-16 MED ORDER — TERBINAFINE HCL 250 MG PO TABS: 250.0000 mg | ORAL_TABLET | Freq: Every day | ORAL | Status: DC

## 2013-11-16 NOTE — Assessment & Plan Note (Signed)
Encouraged to quit smoking.  

## 2013-11-16 NOTE — Assessment & Plan Note (Signed)
F/u CT (-). Will mark as resolved.

## 2013-11-16 NOTE — Assessment & Plan Note (Signed)
Doing well. Will continue his stribild. His Cr has mild increase. Offered, refuses condoms. Refuses flu shot. Will see him back in 6 months.

## 2013-11-16 NOTE — Assessment & Plan Note (Signed)
No change in norvasc, fairly well controlled today.

## 2013-11-16 NOTE — Progress Notes (Signed)
   Subjective:    Patient ID: Noah Perez, male    DOB: 25-Oct-1951, 63 y.o.   MRN: [id number]  HPI 63 yo M with hx of HIV+ since 1996. He has been taking atripla until Nov 2013 when he was changed to stribild. No problems with ART, no missed.  Has had some R foot/ankle swelling during the day, resolved after sleeping. Not sore. Also with c/o toe nail fungus.   HIV 1 RNA Quant (copies/mL)  Date Value  10/13/2013 <20   11/21/2012 <20   09/12/2012 <20      CD4 T Cell Abs (/uL)  Date Value  10/13/2013 610   11/21/2012 500   09/12/2012 380*   Has cut down his smoking 1ppd --> 1/2 ppd.    Review of Systems  Constitutional: Negative for fever, chills, appetite change and unexpected weight change.  Gastrointestinal: Negative for diarrhea and constipation.  Genitourinary: Negative for difficulty urinating.  Musculoskeletal: Positive for joint swelling.       Objective:   Physical Exam  Constitutional: He appears well-developed and well-nourished.  HENT:  Mouth/Throat: No oropharyngeal exudate.  Eyes: EOM are normal. Pupils are equal, round, and reactive to light.  Neck: Neck supple.  Cardiovascular: Normal rate, regular rhythm and normal heart sounds.   Pulmonary/Chest: Effort normal and breath sounds normal.  Abdominal: Soft. Bowel sounds are normal. There is no tenderness. There is no rebound.  Musculoskeletal:       Right ankle: Normal. No tenderness.       Feet:  Lymphadenopathy:    He has no cervical adenopathy.          Assessment & Plan:

## 2013-11-16 NOTE — Assessment & Plan Note (Signed)
Will give him trial of lamisil.  

## 2013-11-16 NOTE — Assessment & Plan Note (Signed)
As noted onCT. Encouraged pt to quit smoking.

## 2013-12-17 ENCOUNTER — Other Ambulatory Visit: Payer: Self-pay | Admitting: Infectious Diseases

## 2014-02-08 ENCOUNTER — Other Ambulatory Visit: Payer: Self-pay | Admitting: Infectious Diseases

## 2014-04-26 ENCOUNTER — Other Ambulatory Visit: Payer: Self-pay

## 2014-04-26 DIAGNOSIS — I10 Essential (primary) hypertension: Secondary | ICD-10-CM

## 2014-04-26 MED ORDER — AMLODIPINE BESYLATE 10 MG PO TABS: 10.0000 mg | ORAL_TABLET | Freq: Every day | ORAL | Status: DC

## 2014-05-04 ENCOUNTER — Other Ambulatory Visit: Payer: Self-pay | Admitting: Infectious Diseases

## 2014-05-04 DIAGNOSIS — B20 Human immunodeficiency virus [HIV] disease: Secondary | ICD-10-CM

## 2014-05-11 ENCOUNTER — Other Ambulatory Visit: Payer: BC Managed Care – PPO

## 2014-05-24 ENCOUNTER — Ambulatory Visit: Payer: BC Managed Care – PPO | Admitting: Infectious Diseases

## 2014-09-22 ENCOUNTER — Other Ambulatory Visit: Payer: Self-pay | Admitting: Infectious Diseases

## 2014-09-24 ENCOUNTER — Other Ambulatory Visit: Payer: Self-pay | Admitting: Infectious Diseases

## 2014-09-27 ENCOUNTER — Other Ambulatory Visit: Payer: Self-pay | Admitting: *Deleted

## 2014-09-27 DIAGNOSIS — I10 Essential (primary) hypertension: Secondary | ICD-10-CM

## 2014-09-27 MED ORDER — AMLODIPINE BESYLATE 10 MG PO TABS: 10.0000 mg | ORAL_TABLET | Freq: Every day | ORAL | Status: DC

## 2014-11-08 ENCOUNTER — Other Ambulatory Visit: Payer: Self-pay | Admitting: Infectious Diseases

## 2014-11-18 ENCOUNTER — Ambulatory Visit: Payer: Self-pay | Admitting: Infectious Diseases

## 2014-11-29 ENCOUNTER — Encounter: Payer: Self-pay | Admitting: Infectious Diseases

## 2014-11-29 ENCOUNTER — Ambulatory Visit (INDEPENDENT_AMBULATORY_CARE_PROVIDER_SITE_OTHER): Payer: BLUE CROSS/BLUE SHIELD | Admitting: Infectious Diseases

## 2014-11-29 ENCOUNTER — Other Ambulatory Visit: Payer: Self-pay | Admitting: Infectious Diseases

## 2014-11-29 VITALS — BP 131/81 | HR 60 | Temp 97.5°F | Ht 71.0 in | Wt 133.0 lb

## 2014-11-29 DIAGNOSIS — M129 Arthropathy, unspecified: Secondary | ICD-10-CM

## 2014-11-29 DIAGNOSIS — F17200 Nicotine dependence, unspecified, uncomplicated: Secondary | ICD-10-CM

## 2014-11-29 DIAGNOSIS — Z720 Tobacco use: Secondary | ICD-10-CM | POA: Diagnosis not present

## 2014-11-29 DIAGNOSIS — J438 Other emphysema: Secondary | ICD-10-CM

## 2014-11-29 DIAGNOSIS — Z113 Encounter for screening for infections with a predominantly sexual mode of transmission: Secondary | ICD-10-CM

## 2014-11-29 DIAGNOSIS — M19071 Primary osteoarthritis, right ankle and foot: Secondary | ICD-10-CM | POA: Insufficient documentation

## 2014-11-29 DIAGNOSIS — B20 Human immunodeficiency virus [HIV] disease: Secondary | ICD-10-CM

## 2014-11-29 DIAGNOSIS — Z23 Encounter for immunization: Secondary | ICD-10-CM

## 2014-11-29 DIAGNOSIS — I10 Essential (primary) hypertension: Secondary | ICD-10-CM

## 2014-11-29 DIAGNOSIS — Z79899 Other long term (current) drug therapy: Secondary | ICD-10-CM

## 2014-11-29 LAB — COMPREHENSIVE METABOLIC PANEL
ALT: 17 U/L (ref 0–53)
AST: 18 U/L (ref 0–37)
Albumin: 4.4 g/dL (ref 3.5–5.2)
Alkaline Phosphatase: 95 U/L (ref 39–117)
BUN: 13 mg/dL (ref 6–23)
CALCIUM: 9.2 mg/dL (ref 8.4–10.5)
CHLORIDE: 105 meq/L (ref 96–112)
CO2: 27 meq/L (ref 19–32)
Creat: 1.14 mg/dL (ref 0.50–1.35)
Glucose, Bld: 85 mg/dL (ref 70–99)
Potassium: 3.9 mEq/L (ref 3.5–5.3)
SODIUM: 140 meq/L (ref 135–145)
Total Bilirubin: 0.8 mg/dL (ref 0.2–1.2)
Total Protein: 7.1 g/dL (ref 6.0–8.3)

## 2014-11-29 LAB — CBC
HCT: 49.8 % (ref 39.0–52.0)
Hemoglobin: 17.6 g/dL — ABNORMAL HIGH (ref 13.0–17.0)
MCH: 31.4 pg (ref 26.0–34.0)
MCHC: 35.3 g/dL (ref 30.0–36.0)
MCV: 88.8 fL (ref 78.0–100.0)
MPV: 10.1 fL (ref 8.6–12.4)
Platelets: 217 10*3/uL (ref 150–400)
RBC: 5.61 MIL/uL (ref 4.22–5.81)
RDW: 13.8 % (ref 11.5–15.5)
WBC: 6.6 10*3/uL (ref 4.0–10.5)

## 2014-11-29 LAB — RPR

## 2014-11-29 LAB — LIPID PANEL
CHOL/HDL RATIO: 2.6 ratio
CHOLESTEROL: 135 mg/dL (ref 0–200)
HDL: 51 mg/dL (ref >39)
LDL CALC: 71 mg/dL (ref 0–99)
Triglycerides: 67 mg/dL (ref <150)
VLDL: 13 mg/dL (ref 0–40)

## 2014-11-29 LAB — HEPATITIS B SURFACE ANTIBODY,QUALITATIVE: Hep B S Ab: NEGATIVE

## 2014-11-29 MED ORDER — ELVITEG-COBIC-EMTRICIT-TENOFAF 150-150-200-10 MG PO TABS: 1.0000 | ORAL_TABLET | Freq: Every day | ORAL | Status: DC

## 2014-11-29 NOTE — Assessment & Plan Note (Signed)
Encouraged to quit. 

## 2014-11-29 NOTE — Assessment & Plan Note (Signed)
Offered/refuses condoms. Needs pneumovax. rtc 6 months. Check labs today.  Doing great. Change to genvoya at next med refill.

## 2014-11-29 NOTE — Addendum Note (Signed)
Addended bySteva Colder: Amanda Pote on: 11/29/2014 10:26 AM   Modules accepted: Orders

## 2014-11-29 NOTE — Addendum Note (Signed)
Addended by: Andree CossHOWELL, Waynette Towers M on: 11/29/2014 11:18 AM   Modules accepted: Orders, Medications

## 2014-11-29 NOTE — Assessment & Plan Note (Signed)
Asx. Encourage to quit smoking

## 2014-11-29 NOTE — Progress Notes (Signed)
   Subjective:    Patient ID: Lenice LlamasGordon K Harty, male    DOB: 1951/04/20, 64 y.o.   MRN: [id number]  HPI 64 yo M with hx of HIV+ since 1996. He has been taking atripla until Nov 2013 when he was changed to stribild.   also hx of emphysema as noted on CT scan, tobacco use and HTN. At last visit was treated for onychomycosis.  Has been having pain in R 4th toe after accidentally kicking vacuum cleaner. Worsens as day goes on.   HIV 1 RNA QUANT (copies/mL)  Date Value  10/13/2013 <20  11/21/2012 <20  09/12/2012 <20   CD4 T CELL ABS  Date Value  10/13/2013 610 /uL  11/21/2012 500 cmm  09/12/2012 380 cmm*   No problems with ART. Still smoking 1/2 ppd.  Refuses flu shot.  In relationship with HIV+ woman  Review of Systems  Constitutional: Negative for fever, chills, appetite change and unexpected weight change.  Respiratory: Negative for cough and shortness of breath.   Gastrointestinal: Negative for diarrhea and constipation.  Genitourinary: Negative for difficulty urinating.       Objective:   Physical Exam  Constitutional: He appears well-developed and well-nourished.  HENT:  Mouth/Throat: No oropharyngeal exudate.  Eyes: EOM are normal. Pupils are equal, round, and reactive to light.  Neck: Neck supple.  Cardiovascular: Normal rate, regular rhythm and normal heart sounds.   Pulmonary/Chest: Effort normal and breath sounds normal.  Abdominal: Soft. Bowel sounds are normal. There is no tenderness.  Lymphadenopathy:    He has no cervical adenopathy.  Skin:             Assessment & Plan:

## 2014-11-29 NOTE — Assessment & Plan Note (Signed)
No palpable defect on palpation.  Suggest he try alleve (with food) or tylenol.

## 2014-11-29 NOTE — Assessment & Plan Note (Signed)
Doing well, asx. His BP is well controlled today.

## 2014-11-30 LAB — T-HELPER CELL (CD4) - (RCID CLINIC ONLY)
CD4 % Helper T Cell: 36 % (ref 33–55)
CD4 T CELL ABS: 550 /uL (ref 400–2700)

## 2014-11-30 LAB — HIV-1 RNA QUANT-NO REFLEX-BLD
HIV 1 RNA QUANT: 30 {copies}/mL — AB (ref <20)
HIV-1 RNA QUANT, LOG: 1.48 {Log} — AB (ref <1.30)

## 2014-11-30 LAB — URINE CYTOLOGY ANCILLARY ONLY
CHLAMYDIA, DNA PROBE: NEGATIVE
NEISSERIA GONORRHEA: NEGATIVE

## 2015-03-21 ENCOUNTER — Other Ambulatory Visit: Payer: Self-pay | Admitting: *Deleted

## 2015-03-21 DIAGNOSIS — I10 Essential (primary) hypertension: Secondary | ICD-10-CM

## 2015-03-21 MED ORDER — AMLODIPINE BESYLATE 10 MG PO TABS
10.0000 mg | ORAL_TABLET | Freq: Every day | ORAL | Status: DC
Start: 2015-03-21 — End: 2015-06-16

## 2015-06-16 ENCOUNTER — Other Ambulatory Visit: Payer: Self-pay | Admitting: *Deleted

## 2015-06-16 DIAGNOSIS — I10 Essential (primary) hypertension: Secondary | ICD-10-CM

## 2015-06-16 DIAGNOSIS — B20 Human immunodeficiency virus [HIV] disease: Secondary | ICD-10-CM

## 2015-06-16 MED ORDER — ELVITEG-COBIC-EMTRICIT-TENOFAF 150-150-200-10 MG PO TABS: 1.0000 | ORAL_TABLET | Freq: Every day | ORAL | Status: DC

## 2015-06-16 MED ORDER — AMLODIPINE BESYLATE 10 MG PO TABS: 10.0000 mg | ORAL_TABLET | Freq: Every day | ORAL | Status: DC

## 2015-06-21 ENCOUNTER — Other Ambulatory Visit: Payer: Self-pay | Admitting: *Deleted

## 2015-06-21 DIAGNOSIS — I10 Essential (primary) hypertension: Secondary | ICD-10-CM

## 2015-06-21 MED ORDER — AMLODIPINE BESYLATE 10 MG PO TABS: 10.0000 mg | ORAL_TABLET | Freq: Every day | ORAL | Status: DC

## 2015-06-21 NOTE — Progress Notes (Signed)
Patient walked in asking for a prescription.  He met with Robert Bellow for patient assistance. He requested a refill of his blood pressure medication from this RN.  RN sent it as requested to Kristopher Oppenheim at [facility].  Patient agreed to schedule a follow up with Dr. Johnnye Sima. Landis Gandy, RN

## 2015-08-15 ENCOUNTER — Ambulatory Visit (INDEPENDENT_AMBULATORY_CARE_PROVIDER_SITE_OTHER): Payer: Self-pay | Admitting: Infectious Diseases

## 2015-08-15 ENCOUNTER — Encounter: Payer: Self-pay | Admitting: Infectious Diseases

## 2015-08-15 VITALS — BP 137/86 | HR 88 | Temp 97.2°F | Wt 131.0 lb

## 2015-08-15 DIAGNOSIS — B20 Human immunodeficiency virus [HIV] disease: Secondary | ICD-10-CM

## 2015-08-15 DIAGNOSIS — I10 Essential (primary) hypertension: Secondary | ICD-10-CM

## 2015-08-15 LAB — CBC
HCT: 47.9 % (ref 39.0–52.0)
Hemoglobin: 17 g/dL (ref 13.0–17.0)
MCH: 31.8 pg (ref 26.0–34.0)
MCHC: 35.5 g/dL (ref 30.0–36.0)
MCV: 89.7 fL (ref 78.0–100.0)
MPV: 10.2 fL (ref 8.6–12.4)
PLATELETS: 181 10*3/uL (ref 150–400)
RBC: 5.34 MIL/uL (ref 4.22–5.81)
RDW: 14.1 % (ref 11.5–15.5)
WBC: 7.4 10*3/uL (ref 4.0–10.5)

## 2015-08-15 LAB — COMPREHENSIVE METABOLIC PANEL
ALT: 16 U/L (ref 9–46)
AST: 21 U/L (ref 10–35)
Albumin: 4.5 g/dL (ref 3.6–5.1)
Alkaline Phosphatase: 81 U/L (ref 40–115)
BILIRUBIN TOTAL: 0.7 mg/dL (ref 0.2–1.2)
BUN: 15 mg/dL (ref 7–25)
CHLORIDE: 104 mmol/L (ref 98–110)
CO2: 29 mmol/L (ref 20–31)
Calcium: 9.7 mg/dL (ref 8.6–10.3)
Creat: 1.17 mg/dL (ref 0.70–1.25)
Glucose, Bld: 87 mg/dL (ref 65–99)
Potassium: 5.1 mmol/L (ref 3.5–5.3)
SODIUM: 139 mmol/L (ref 135–146)
TOTAL PROTEIN: 7 g/dL (ref 6.1–8.1)

## 2015-08-15 LAB — LIPID PANEL
CHOL/HDL RATIO: 2.6 ratio (ref <=5.0)
Cholesterol: 165 mg/dL (ref 125–200)
HDL: 63 mg/dL (ref >=40)
LDL Cholesterol: 86 mg/dL (ref <130)
Triglycerides: 78 mg/dL (ref <150)
VLDL: 16 mg/dL (ref <30)

## 2015-08-15 NOTE — Assessment & Plan Note (Signed)
Appears to be doing ok, asx.  Will f/u result with CMA

## 2015-08-15 NOTE — Progress Notes (Signed)
   Subjective:    Patient ID: Noah Perez, male    DOB: August 27, 1951, 64 y.o.   MRN: [id number]  HPI 64 yo M with hx of HIV+ since 1996. He has been taking atripla until Nov 2013 when he was changed to stribild then genvoya.   HIV 1 RNA QUANT (copies/mL)  Date Value  11/29/2014 30*  10/13/2013 <20  11/21/2012 <20   CD4 T CELL ABS  Date Value  11/29/2014 550 /uL  10/13/2013 610 /uL  11/21/2012 500 cmm    Has been feeling well. Noted no difference in his meds.  No problems with breathing, still smoking.  Has not had colon.   Review of Systems  Constitutional: Negative for appetite change and unexpected weight change.  Respiratory: Negative for shortness of breath.   Cardiovascular: Negative for chest pain.  Gastrointestinal: Negative for diarrhea, constipation and blood in stool.  Genitourinary: Negative for difficulty urinating.  Neurological: Negative for headaches.       Objective:   Physical Exam  Constitutional: He appears well-developed and well-nourished.  HENT:  Mouth/Throat: No oropharyngeal exudate.  Eyes: EOM are normal. Pupils are equal, round, and reactive to light.  Neck: Neck supple.  Cardiovascular: Normal rate, regular rhythm and normal heart sounds.   Pulmonary/Chest: Effort normal and breath sounds normal.  Abdominal: Soft. Bowel sounds are normal. There is no tenderness. There is no rebound.  Musculoskeletal: He exhibits no edema.  Lymphadenopathy:    He has no cervical adenopathy.       Assessment & Plan:

## 2015-08-15 NOTE — Assessment & Plan Note (Signed)
He is doing well.  Will check his labs today.  Offered/refused condoms.  Refuses colon Refuses flu shot.  Wife is HIV+ Will see him back in 6 months.

## 2015-08-16 LAB — T-HELPER CELL (CD4) - (RCID CLINIC ONLY)
CD4 T CELL HELPER: 44 % (ref 33–55)
CD4 T Cell Abs: 680 /uL (ref 400–2700)

## 2015-08-16 LAB — URINE CYTOLOGY ANCILLARY ONLY
CHLAMYDIA, DNA PROBE: NEGATIVE
Neisseria Gonorrhea: NEGATIVE

## 2015-08-16 LAB — RPR

## 2015-08-17 LAB — HIV-1 RNA QUANT-NO REFLEX-BLD: HIV 1 RNA Quant: <20 copies/mL (ref <20)

## 2015-09-14 NOTE — Progress Notes (Signed)
[facility] notified via fax. Lonney Revak M, RN   

## 2015-10-12 ENCOUNTER — Other Ambulatory Visit: Payer: Self-pay | Admitting: *Deleted

## 2015-10-12 DIAGNOSIS — B20 Human immunodeficiency virus [HIV] disease: Secondary | ICD-10-CM

## 2015-10-12 MED ORDER — ELVITEG-COBIC-EMTRICIT-TENOFAF 150-150-200-10 MG PO TABS: 1.0000 | ORAL_TABLET | Freq: Every day | ORAL | Status: DC

## 2015-11-16 ENCOUNTER — Other Ambulatory Visit: Payer: Self-pay | Admitting: *Deleted

## 2015-11-16 DIAGNOSIS — I10 Essential (primary) hypertension: Secondary | ICD-10-CM

## 2015-11-16 MED ORDER — AMLODIPINE BESYLATE 10 MG PO TABS: 10.0000 mg | ORAL_TABLET | Freq: Every day | ORAL | Status: DC

## 2016-02-06 ENCOUNTER — Ambulatory Visit (INDEPENDENT_AMBULATORY_CARE_PROVIDER_SITE_OTHER): Payer: Self-pay | Admitting: Infectious Diseases

## 2016-02-06 ENCOUNTER — Encounter: Payer: Self-pay | Admitting: Infectious Diseases

## 2016-02-06 VITALS — BP 145/93 | HR 76 | Temp 98.8°F | Wt 141.0 lb

## 2016-02-06 DIAGNOSIS — M25519 Pain in unspecified shoulder: Secondary | ICD-10-CM | POA: Insufficient documentation

## 2016-02-06 DIAGNOSIS — B20 Human immunodeficiency virus [HIV] disease: Secondary | ICD-10-CM

## 2016-02-06 DIAGNOSIS — Z79899 Other long term (current) drug therapy: Secondary | ICD-10-CM

## 2016-02-06 DIAGNOSIS — M25512 Pain in left shoulder: Secondary | ICD-10-CM

## 2016-02-06 DIAGNOSIS — I10 Essential (primary) hypertension: Secondary | ICD-10-CM

## 2016-02-06 DIAGNOSIS — Z113 Encounter for screening for infections with a predominantly sexual mode of transmission: Secondary | ICD-10-CM

## 2016-02-06 LAB — COMPREHENSIVE METABOLIC PANEL
ALK PHOS: 74 U/L (ref 40–115)
ALT: 15 U/L (ref 9–46)
AST: 20 U/L (ref 10–35)
Albumin: 4.1 g/dL (ref 3.6–5.1)
BUN: 25 mg/dL (ref 7–25)
CO2: 26 mmol/L (ref 20–31)
CREATININE: 1.1 mg/dL (ref 0.70–1.25)
Calcium: 9.5 mg/dL (ref 8.6–10.3)
Chloride: 104 mmol/L (ref 98–110)
GLUCOSE: 73 mg/dL (ref 65–99)
POTASSIUM: 4 mmol/L (ref 3.5–5.3)
SODIUM: 138 mmol/L (ref 135–146)
TOTAL PROTEIN: 6.5 g/dL (ref 6.1–8.1)
Total Bilirubin: 0.6 mg/dL (ref 0.2–1.2)

## 2016-02-06 LAB — CBC
HCT: 45.1 % (ref 38.5–50.0)
Hemoglobin: 15.9 g/dL (ref 13.2–17.1)
MCH: 31.9 pg (ref 27.0–33.0)
MCHC: 35.3 g/dL (ref 32.0–36.0)
MCV: 90.6 fL (ref 80.0–100.0)
MPV: 9.9 fL (ref 7.5–12.5)
PLATELETS: 175 10*3/uL (ref 140–400)
RBC: 4.98 MIL/uL (ref 4.20–5.80)
RDW: 13.6 % (ref 11.0–15.0)
WBC: 8 10*3/uL (ref 3.8–10.8)

## 2016-02-06 LAB — LIPID PANEL
CHOL/HDL RATIO: 2.6 ratio (ref <=5.0)
CHOLESTEROL: 146 mg/dL (ref 125–200)
HDL: 56 mg/dL (ref >=40)
LDL Cholesterol: 60 mg/dL (ref <130)
Triglycerides: 151 mg/dL — ABNORMAL HIGH (ref <150)
VLDL: 30 mg/dL (ref <30)

## 2016-02-06 NOTE — Progress Notes (Signed)
   Subjective:    Patient ID: Lenice LlamasGordon K Muraoka, male    DOB: [birth year], 65 y.o.   MRN: [id number]  HPI 65 yo M with hx of HIV+ since 1996. He has been taking atripla until Nov 2013 when he was changed to stribild then genvoya. Fell on ice in January- shoulder not broken (seen in ED). Took 1 week off, ibuprofen, ice. No better. Was seen in ED in [place]. Was given pain rx, plain film negative. Pain starts in shoulder, occas goes into wrist, lower arm. Having difculty sleeping at night. Taking alleve with some relief at night.   No problems with ART.   HIV 1 RNA QUANT (copies/mL)  Date Value  08/15/2015 <20  11/29/2014 30*  10/13/2013 <20   CD4 T CELL ABS (/uL)  Date Value  08/15/2015 680  11/29/2014 550  10/13/2013 610   Review of Systems  Constitutional: Negative for appetite change and unexpected weight change.  Respiratory: Negative for shortness of breath.   Gastrointestinal: Negative for diarrhea and constipation.  Genitourinary: Negative for difficulty urinating.  Musculoskeletal: Positive for arthralgias.      Objective:   Physical Exam  Constitutional: He appears well-developed and well-nourished.  HENT:  Mouth/Throat: No oropharyngeal exudate.  Eyes: EOM are normal. Pupils are equal, round, and reactive to light.  Neck: Neck supple.  Cardiovascular: Normal rate, regular rhythm and normal heart sounds.   Pulmonary/Chest: Effort normal and breath sounds normal.  Abdominal: Soft. Bowel sounds are normal. There is no tenderness. There is no rebound.  Musculoskeletal:       Arms: Lymphadenopathy:    He has no cervical adenopathy.      Assessment & Plan:

## 2016-02-06 NOTE — Assessment & Plan Note (Signed)
Mildly increased today. Will continue to watch. Continue norvasc

## 2016-02-06 NOTE — Assessment & Plan Note (Signed)
He appears to be doing well today.  Will send him for labs.  Offered/refused condoms.  See him back in 6 months.

## 2016-02-06 NOTE — Assessment & Plan Note (Signed)
Can continue alleve Will get orange card Try to get him in with ortho

## 2016-02-07 LAB — HIV-1 RNA QUANT-NO REFLEX-BLD
HIV 1 RNA Quant: <20 copies/mL (ref <20)
HIV-1 RNA Quant, Log: <1.3 Log copies/mL (ref <1.30)

## 2016-02-07 LAB — RPR

## 2016-02-08 LAB — T-HELPER CELL (CD4) - (RCID CLINIC ONLY)
CD4 % Helper T Cell: 40 % (ref 33–55)
CD4 T Cell Abs: 820 /uL (ref 400–2700)

## 2016-02-09 ENCOUNTER — Ambulatory Visit: Payer: Self-pay | Admitting: Infectious Diseases

## 2016-02-14 ENCOUNTER — Telehealth: Payer: Self-pay | Admitting: *Deleted

## 2016-02-14 NOTE — Telephone Encounter (Signed)
Pt is NOT eligible for "Orange Card."  Pt willing to pay "out-of-pocket" to be seen by referral Orthopedic Surgery MD.  Need the pt to choose a Orthopedic Surgeon.

## 2016-05-07 ENCOUNTER — Ambulatory Visit: Payer: Self-pay

## 2016-05-09 ENCOUNTER — Encounter: Payer: Self-pay | Admitting: Infectious Diseases

## 2016-05-11 ENCOUNTER — Other Ambulatory Visit: Payer: Self-pay | Admitting: Infectious Diseases

## 2016-11-12 ENCOUNTER — Ambulatory Visit: Payer: Self-pay

## 2016-11-26 ENCOUNTER — Ambulatory Visit: Payer: Self-pay

## 2016-12-21 ENCOUNTER — Ambulatory Visit: Payer: Self-pay

## 2016-12-21 ENCOUNTER — Other Ambulatory Visit: Payer: Self-pay | Admitting: Infectious Diseases

## 2016-12-21 DIAGNOSIS — I10 Essential (primary) hypertension: Secondary | ICD-10-CM

## 2016-12-25 ENCOUNTER — Encounter: Payer: Self-pay | Admitting: Infectious Diseases

## 2017-01-15 ENCOUNTER — Other Ambulatory Visit: Payer: Self-pay | Admitting: Infectious Diseases

## 2017-02-07 ENCOUNTER — Other Ambulatory Visit: Payer: Self-pay | Admitting: Infectious Diseases

## 2017-02-07 DIAGNOSIS — I10 Essential (primary) hypertension: Secondary | ICD-10-CM

## 2017-02-26 ENCOUNTER — Other Ambulatory Visit: Payer: Self-pay | Admitting: Infectious Diseases

## 2017-02-26 ENCOUNTER — Other Ambulatory Visit: Payer: Self-pay | Admitting: *Deleted

## 2017-02-26 DIAGNOSIS — I10 Essential (primary) hypertension: Secondary | ICD-10-CM

## 2017-02-26 DIAGNOSIS — B20 Human immunodeficiency virus [HIV] disease: Secondary | ICD-10-CM

## 2017-02-26 MED ORDER — ELVITEG-COBIC-EMTRICIT-TENOFAF 150-150-200-10 MG PO TABS
1.0000 | ORAL_TABLET | Freq: Every day | ORAL | 0 refills | Status: DC
Start: 2017-02-26 — End: 2017-03-22

## 2017-02-26 MED ORDER — AMLODIPINE BESYLATE 10 MG PO TABS: ORAL_TABLET | ORAL | 0 refills | Status: DC

## 2017-03-22 ENCOUNTER — Other Ambulatory Visit: Payer: Self-pay | Admitting: Infectious Diseases

## 2017-03-22 DIAGNOSIS — B20 Human immunodeficiency virus [HIV] disease: Secondary | ICD-10-CM

## 2017-03-22 DIAGNOSIS — I10 Essential (primary) hypertension: Secondary | ICD-10-CM

## 2017-04-15 ENCOUNTER — Encounter: Payer: Self-pay | Admitting: Infectious Diseases

## 2017-04-15 ENCOUNTER — Ambulatory Visit (INDEPENDENT_AMBULATORY_CARE_PROVIDER_SITE_OTHER): Payer: Medicare Other | Admitting: Infectious Diseases

## 2017-04-15 VITALS — BP 116/76 | HR 64 | Temp 97.8°F | Ht 71.0 in | Wt 140.0 lb

## 2017-04-15 DIAGNOSIS — B20 Human immunodeficiency virus [HIV] disease: Secondary | ICD-10-CM

## 2017-04-15 DIAGNOSIS — Z113 Encounter for screening for infections with a predominantly sexual mode of transmission: Secondary | ICD-10-CM | POA: Diagnosis not present

## 2017-04-15 DIAGNOSIS — Z79899 Other long term (current) drug therapy: Secondary | ICD-10-CM | POA: Diagnosis not present

## 2017-04-15 DIAGNOSIS — F17200 Nicotine dependence, unspecified, uncomplicated: Secondary | ICD-10-CM | POA: Diagnosis not present

## 2017-04-15 DIAGNOSIS — Z23 Encounter for immunization: Secondary | ICD-10-CM

## 2017-04-15 DIAGNOSIS — Z0000 Encounter for general adult medical examination without abnormal findings: Secondary | ICD-10-CM

## 2017-04-15 DIAGNOSIS — I10 Essential (primary) hypertension: Secondary | ICD-10-CM | POA: Diagnosis not present

## 2017-04-15 LAB — LIPID PANEL
CHOL/HDL RATIO: 2.4 ratio (ref <5.0)
Cholesterol: 167 mg/dL (ref <200)
HDL: 69 mg/dL (ref >40)
LDL CALC: 85 mg/dL (ref <100)
TRIGLYCERIDES: 64 mg/dL (ref <150)
VLDL: 13 mg/dL (ref <30)

## 2017-04-15 LAB — COMPREHENSIVE METABOLIC PANEL
ALT: 24 U/L (ref 9–46)
AST: 27 U/L (ref 10–35)
Albumin: 4.1 g/dL (ref 3.6–5.1)
Alkaline Phosphatase: 74 U/L (ref 40–115)
BUN: 16 mg/dL (ref 7–25)
CALCIUM: 9.4 mg/dL (ref 8.6–10.3)
CO2: 24 mmol/L (ref 20–31)
Chloride: 106 mmol/L (ref 98–110)
Creat: 1.43 mg/dL — ABNORMAL HIGH (ref 0.70–1.25)
GLUCOSE: 82 mg/dL (ref 65–99)
POTASSIUM: 3.9 mmol/L (ref 3.5–5.3)
Sodium: 141 mmol/L (ref 135–146)
Total Bilirubin: 1 mg/dL (ref 0.2–1.2)
Total Protein: 7 g/dL (ref 6.1–8.1)

## 2017-04-15 LAB — CBC WITH DIFFERENTIAL/PLATELET
BASOS ABS: 60 {cells}/uL (ref 0–200)
Basophils Relative: 1 %
EOS ABS: 60 {cells}/uL (ref 15–500)
Eosinophils Relative: 1 %
HCT: 51.7 % — ABNORMAL HIGH (ref 38.5–50.0)
Hemoglobin: 17.7 g/dL — ABNORMAL HIGH (ref 13.2–17.1)
LYMPHS PCT: 23 %
Lymphs Abs: 1380 cells/uL (ref 850–3900)
MCH: 31.9 pg (ref 27.0–33.0)
MCHC: 34.2 g/dL (ref 32.0–36.0)
MCV: 93.2 fL (ref 80.0–100.0)
MONO ABS: 600 {cells}/uL (ref 200–950)
MPV: 10.4 fL (ref 7.5–12.5)
Monocytes Relative: 10 %
NEUTROS PCT: 65 %
Neutro Abs: 3900 cells/uL (ref 1500–7800)
Platelets: 200 10*3/uL (ref 140–400)
RBC: 5.55 MIL/uL (ref 4.20–5.80)
RDW: 13.3 % (ref 11.0–15.0)
WBC: 6 10*3/uL (ref 3.8–10.8)

## 2017-04-15 MED ORDER — VARENICLINE TARTRATE 0.5 MG X 11 & 1 MG X 42 PO MISC: ORAL | 0 refills | Status: DC

## 2017-04-15 NOTE — Assessment & Plan Note (Addendum)
He is doing well Will continue his genvoya.  Offered/refused condoms.  Refuses colonoscopy Tet booster today Will check his labs today Will see him back in 1 year.

## 2017-04-15 NOTE — Progress Notes (Signed)
   Subjective:    Patient ID: Noah Perez, male    DOB: [birth year], 66 y.o.   MRN: [id number]  HPI 66 yo M with hx of HIV+ since 1996. He has been taking atripla until Nov 2013 when he was changed to stribild then genvoya. Has been feeling great.  Has not had colon.  Last Tet vax? Still has some shoulder pain, improving though.   HIV 1 RNA Quant (copies/mL)  Date Value  02/06/2016 <20  08/15/2015 <20  11/29/2014 30 (H)   CD4 T Cell Abs (/uL)  Date Value  02/07/2016 820  08/15/2015 680  11/29/2014 550    Review of Systems  Constitutional: Negative for appetite change, chills, fever and unexpected weight change.  Respiratory: Negative for cough and shortness of breath.   Gastrointestinal: Negative for constipation and diarrhea.  Genitourinary: Negative for difficulty urinating.  Musculoskeletal: Positive for arthralgias.  Psychiatric/Behavioral: Negative for dysphoric mood.  smokes 1ppd. If he's busy he doesn't think about it. Has tried patches, gum without relief.  DOE with climbing stairs.      Objective:   Physical Exam  Constitutional: He appears well-developed and well-nourished.  HENT:  Mouth/Throat: No oropharyngeal exudate.  Eyes: EOM are normal. Pupils are equal, round, and reactive to light.  Neck: Neck supple.  Cardiovascular: Normal rate, regular rhythm and normal heart sounds.   Pulmonary/Chest: Effort normal and breath sounds normal.  Abdominal: Soft. Bowel sounds are normal. There is no tenderness. There is no rebound.  Musculoskeletal: He exhibits no edema.  Lymphadenopathy:    He has no cervical adenopathy.  Psychiatric: He has a normal mood and affect.      Assessment & Plan:

## 2017-04-15 NOTE — Addendum Note (Signed)
Addended by: Andree CossHOWELL, Anjulie Dipierro M on: 04/15/2017 10:22 AM   Modules accepted: Orders

## 2017-04-15 NOTE — Assessment & Plan Note (Signed)
Will continue norvasc

## 2017-04-15 NOTE — Assessment & Plan Note (Addendum)
He asks for rx to help stop cravings.  Will give him chantix

## 2017-04-16 LAB — RPR

## 2017-04-16 LAB — T-HELPER CELL (CD4) - (RCID CLINIC ONLY)
CD4 % Helper T Cell: 35 % (ref 33–55)
CD4 T CELL ABS: 550 /uL (ref 400–2700)

## 2017-04-19 LAB — HIV-1 RNA QUANT-NO REFLEX-BLD
HIV 1 RNA QUANT: 23 {copies}/mL — AB
HIV-1 RNA QUANT, LOG: 1.36 {Log_copies}/mL — AB

## 2017-05-06 ENCOUNTER — Other Ambulatory Visit: Payer: Self-pay | Admitting: *Deleted

## 2017-05-06 ENCOUNTER — Telehealth: Payer: Self-pay | Admitting: *Deleted

## 2017-05-06 DIAGNOSIS — F17200 Nicotine dependence, unspecified, uncomplicated: Secondary | ICD-10-CM

## 2017-05-06 MED ORDER — VARENICLINE TARTRATE 0.5 MG X 11 & 1 MG X 42 PO MISC: ORAL | 0 refills | Status: DC

## 2017-05-06 NOTE — Telephone Encounter (Signed)
Having difficulty receiving prescrption for Chantix.  Patient has Part D Medicare and supplemental SPAP.   Part D Medicare, [employer].  RN spoke with [employer] and the Chantix rx was originally denied.  Paperwork from [employer] was not previously forwarded regarding appealing this decision.  RN requested denial and appeal information be faxed to [phone] to be completed.  Notified the patient of this information and reason for delay in receiving the medication.

## 2017-05-10 ENCOUNTER — Other Ambulatory Visit: Payer: Self-pay | Admitting: Infectious Diseases

## 2017-05-10 ENCOUNTER — Telehealth: Payer: Self-pay | Admitting: Infectious Diseases

## 2017-05-10 DIAGNOSIS — B20 Human immunodeficiency virus [HIV] disease: Secondary | ICD-10-CM

## 2017-05-10 DIAGNOSIS — I10 Essential (primary) hypertension: Secondary | ICD-10-CM

## 2017-05-10 DIAGNOSIS — F17200 Nicotine dependence, unspecified, uncomplicated: Secondary | ICD-10-CM

## 2017-05-10 MED ORDER — BUPROPION HCL ER (SR) 150 MG PO TB12: 150.0000 mg | ORAL_TABLET | Freq: Every day | ORAL | 3 refills | Status: DC

## 2017-05-10 NOTE — Telephone Encounter (Signed)
Received appeal for Chantix for pt.  He does not meet criteria for appeal.  I attempted to call him to explain this, his VM for his cell does not work. I did not leave a message on his work phone, was unable to get through phone tree.  Will change his chantix to wellbutrin, he does not have a hx of seizures.

## 2017-05-10 NOTE — Telephone Encounter (Signed)
Nre RX for smoking abuse

## 2017-05-10 NOTE — Addendum Note (Signed)
Addended by: Jennet MaduroESTRIDGE, DENISE D on: 05/10/2017 11:56 AM   Modules accepted: Orders

## 2017-07-18 ENCOUNTER — Encounter: Payer: Self-pay | Admitting: *Deleted

## 2017-10-09 ENCOUNTER — Other Ambulatory Visit: Payer: Self-pay | Admitting: *Deleted

## 2017-10-09 DIAGNOSIS — I10 Essential (primary) hypertension: Secondary | ICD-10-CM

## 2017-10-09 DIAGNOSIS — B20 Human immunodeficiency virus [HIV] disease: Secondary | ICD-10-CM

## 2017-10-09 MED ORDER — AMLODIPINE BESYLATE 10 MG PO TABS: 10.0000 mg | ORAL_TABLET | Freq: Every day | ORAL | 3 refills | Status: DC

## 2017-10-09 MED ORDER — ELVITEG-COBIC-EMTRICIT-TENOFAF 150-150-200-10 MG PO TABS: ORAL_TABLET | ORAL | 3 refills | Status: DC

## 2017-11-11 ENCOUNTER — Encounter: Payer: Self-pay | Admitting: Infectious Diseases

## 2017-11-11 ENCOUNTER — Ambulatory Visit (INDEPENDENT_AMBULATORY_CARE_PROVIDER_SITE_OTHER): Payer: Self-pay | Admitting: Infectious Diseases

## 2017-11-11 ENCOUNTER — Other Ambulatory Visit (HOSPITAL_COMMUNITY)
Admission: RE | Admit: 2017-11-11 | Discharge: 2017-11-11 | Disposition: A | Discharge status: Home / Self Care | Payer: Medicare Other | Source: Ambulatory Visit | Attending: Infectious Diseases | Admitting: Infectious Diseases

## 2017-11-11 VITALS — BP 149/76 | HR 79 | Temp 98.1°F | Ht 71.0 in | Wt 140.0 lb

## 2017-11-11 DIAGNOSIS — Z79899 Other long term (current) drug therapy: Secondary | ICD-10-CM

## 2017-11-11 DIAGNOSIS — B20 Human immunodeficiency virus [HIV] disease: Secondary | ICD-10-CM

## 2017-11-11 DIAGNOSIS — Z113 Encounter for screening for infections with a predominantly sexual mode of transmission: Secondary | ICD-10-CM

## 2017-11-11 DIAGNOSIS — I10 Essential (primary) hypertension: Secondary | ICD-10-CM

## 2017-11-11 DIAGNOSIS — J438 Other emphysema: Secondary | ICD-10-CM

## 2017-11-11 MED ORDER — ALBUTEROL SULFATE HFA 108 (90 BASE) MCG/ACT IN AERS: 2.0000 | INHALATION_SPRAY | Freq: Four times a day (QID) | RESPIRATORY_TRACT | 6 refills | Status: DC | PRN

## 2017-11-11 NOTE — Assessment & Plan Note (Signed)
Continues on norvasc.  BP is a little up today, prev well controlled.

## 2017-11-11 NOTE — Assessment & Plan Note (Signed)
He is doing well Defers flu shot.  Will check his labs today.  No change in meds.  Will see him back in 1 year. He is currently working in [place].

## 2017-11-11 NOTE — Progress Notes (Signed)
   Subjective:    Patient ID: Noah Perez, male    DOB: Oct 14, 1951, 67 y.o.   MRN: [id number]  HPI 67 yo M with hx of HIV+ since 1996. He has been taking atripla until Nov 2013 when he was changed to stribild then genvoya. Was in hsopital in [place] due to SOB. He was told he had COPD and was started on adviar bid. He has not felt like this works, has occas episodes of DOE. Was able to do yard work for several hours yesterday.  He has not had an albuterol inh.  Has quit smoking.   HIV 1 RNA Quant (copies/mL)  Date Value  04/15/2017 23 (H)  02/06/2016 <20  08/15/2015 <20   CD4 T Cell Abs (/uL)  Date Value  04/15/2017 550  02/07/2016 820  08/15/2015 680    Review of Systems  Constitutional: Negative for appetite change and unexpected weight change.  Respiratory: Positive for shortness of breath. Negative for cough.   Gastrointestinal: Negative for constipation and diarrhea.  Genitourinary: Negative for difficulty urinating.  Psychiatric/Behavioral: Negative for sleep disturbance.  occas nocturia.  Please see HPI. All other systems reviewed and negative.     Objective:   Physical Exam  Constitutional: He appears well-developed and well-nourished.  HENT:  Mouth/Throat: No oropharyngeal exudate.  Eyes: EOM are normal. Pupils are equal, round, and reactive to light.  Neck: Neck supple.  Cardiovascular: Normal rate, regular rhythm and normal heart sounds.  Pulmonary/Chest: Effort normal and breath sounds normal.  Abdominal: Soft. Bowel sounds are normal. There is no tenderness. There is no rebound.  Musculoskeletal: He exhibits no edema.  Lymphadenopathy:    He has no cervical adenopathy.  Psychiatric: He has a normal mood and affect.      Assessment & Plan:

## 2017-11-11 NOTE — Assessment & Plan Note (Signed)
Will continue advair.  Will prn albuterol.

## 2017-11-12 ENCOUNTER — Other Ambulatory Visit: Payer: Self-pay | Admitting: *Deleted

## 2017-11-12 LAB — CBC
HCT: 46.2 % (ref 38.5–50.0)
HEMOGLOBIN: 16.4 g/dL (ref 13.2–17.1)
MCH: 31.6 pg (ref 27.0–33.0)
MCHC: 35.5 g/dL (ref 32.0–36.0)
MCV: 89 fL (ref 80.0–100.0)
MPV: 10.6 fL (ref 7.5–12.5)
PLATELETS: 192 10*3/uL (ref 140–400)
RBC: 5.19 10*6/uL (ref 4.20–5.80)
RDW: 12.6 % (ref 11.0–15.0)
WBC: 7.9 10*3/uL (ref 3.8–10.8)

## 2017-11-12 LAB — COMPREHENSIVE METABOLIC PANEL
AG RATIO: 1.4 (calc) (ref 1.0–2.5)
ALT: 14 U/L (ref 9–46)
AST: 15 U/L (ref 10–35)
Albumin: 4.2 g/dL (ref 3.6–5.1)
Alkaline phosphatase (APISO): 87 U/L (ref 40–115)
BUN: 19 mg/dL (ref 7–25)
CO2: 27 mmol/L (ref 20–32)
CREATININE: 1.25 mg/dL (ref 0.70–1.25)
Calcium: 9.3 mg/dL (ref 8.6–10.3)
Chloride: 104 mmol/L (ref 98–110)
GLUCOSE: 90 mg/dL (ref 65–99)
Globulin: 2.9 g/dL (calc) (ref 1.9–3.7)
Potassium: 3.8 mmol/L (ref 3.5–5.3)
SODIUM: 140 mmol/L (ref 135–146)
TOTAL PROTEIN: 7.1 g/dL (ref 6.1–8.1)
Total Bilirubin: 1 mg/dL (ref 0.2–1.2)

## 2017-11-12 LAB — T-HELPER CELL (CD4) - (RCID CLINIC ONLY)
CD4 T CELL ABS: 550 /uL (ref 400–2700)
CD4 T CELL HELPER: 26 % — AB (ref 33–55)

## 2017-11-12 LAB — URINE CYTOLOGY ANCILLARY ONLY
CHLAMYDIA, DNA PROBE: NEGATIVE
NEISSERIA GONORRHEA: NEGATIVE

## 2017-11-12 LAB — RPR: RPR: NONREACTIVE

## 2017-11-12 LAB — LIPID PANEL
Cholesterol: 181 mg/dL (ref <200)
HDL: 61 mg/dL (ref >40)
LDL Cholesterol (Calc): 99 mg/dL (calc)
NON-HDL CHOLESTEROL (CALC): 120 mg/dL (ref <130)
Total CHOL/HDL Ratio: 3 (calc) (ref <5.0)
Triglycerides: 114 mg/dL (ref <150)

## 2017-11-12 MED ORDER — ADVAIR DISKUS 250-50 MCG/DOSE IN AEPB: 1.0000 | INHALATION_SPRAY | Freq: Two times a day (BID) | RESPIRATORY_TRACT | 4 refills | Status: DC

## 2017-11-13 LAB — HIV-1 RNA QUANT-NO REFLEX-BLD
HIV 1 RNA Quant: <20 copies/mL — AB
HIV-1 RNA Quant, Log: <1.3 Log copies/mL — AB

## 2018-02-10 ENCOUNTER — Other Ambulatory Visit: Payer: Self-pay | Admitting: Infectious Diseases

## 2018-02-10 DIAGNOSIS — I10 Essential (primary) hypertension: Secondary | ICD-10-CM

## 2018-02-10 DIAGNOSIS — B20 Human immunodeficiency virus [HIV] disease: Secondary | ICD-10-CM

## 2018-09-05 ENCOUNTER — Telehealth: Payer: Self-pay | Admitting: *Deleted

## 2018-09-05 NOTE — Telephone Encounter (Signed)
Patient on Dr Moshe Cipro transfer list. RN offered appointment with Marcos Eke. Patient tentatively scheduled for 12/27 at 8:45. He will call to confirm once he gets his work schedule. Andree Coss, RN

## 2018-10-31 ENCOUNTER — Encounter: Payer: Self-pay | Admitting: Family

## 2018-10-31 ENCOUNTER — Ambulatory Visit (INDEPENDENT_AMBULATORY_CARE_PROVIDER_SITE_OTHER): Payer: BLUE CROSS/BLUE SHIELD | Admitting: Family

## 2018-10-31 ENCOUNTER — Other Ambulatory Visit (HOSPITAL_COMMUNITY)
Admission: RE | Admit: 2018-10-31 | Discharge: 2018-10-31 | Disposition: A | Discharge status: Home / Self Care | Payer: BLUE CROSS/BLUE SHIELD | Source: Ambulatory Visit | Attending: Family | Admitting: Family

## 2018-10-31 VITALS — BP 131/76 | HR 64 | Temp 98.0°F | Ht 71.0 in | Wt 141.0 lb

## 2018-10-31 DIAGNOSIS — Z0000 Encounter for general adult medical examination without abnormal findings: Secondary | ICD-10-CM | POA: Diagnosis not present

## 2018-10-31 DIAGNOSIS — I10 Essential (primary) hypertension: Secondary | ICD-10-CM

## 2018-10-31 DIAGNOSIS — Z23 Encounter for immunization: Secondary | ICD-10-CM

## 2018-10-31 DIAGNOSIS — B20 Human immunodeficiency virus [HIV] disease: Secondary | ICD-10-CM | POA: Insufficient documentation

## 2018-10-31 DIAGNOSIS — J438 Other emphysema: Secondary | ICD-10-CM

## 2018-10-31 DIAGNOSIS — F17200 Nicotine dependence, unspecified, uncomplicated: Secondary | ICD-10-CM | POA: Diagnosis not present

## 2018-10-31 LAB — T-HELPER CELL (CD4) - (RCID CLINIC ONLY)
CD4 T CELL ABS: 400 /uL (ref 400–2700)
CD4 T CELL HELPER: 33 % (ref 33–55)

## 2018-10-31 MED ORDER — ELVITEG-COBIC-EMTRICIT-TENOFAF 150-150-200-10 MG PO TABS: 1.0000 | ORAL_TABLET | Freq: Every day | ORAL | 11 refills | Status: DC

## 2018-10-31 MED ORDER — AMLODIPINE BESYLATE 10 MG PO TABS: ORAL_TABLET | ORAL | 11 refills | Status: DC

## 2018-10-31 NOTE — Progress Notes (Signed)
Subjective:    Patient ID: Noah Perez, male    DOB: October 05, 1951, 67 y.o.   MRN: [id number]  Chief Complaint  Patient presents with  . Follow-up    B20    HPI:  Noah Perez is a 67 y.o. male who presents today for routine follow up of HIV disease.  1.) HIV disease - Noah Perez was last seen in the office on 11/11/17 for routine follow up with good adherence and tolerance of his regimen of Genvoya. Viral load was suppressed and was undetectable with CD4 count of 550. Noah Perez has not had any recent blood work. Health maintenance due includes influenza, Prevnar, dental screen and colonoscopy.   Noah Perez continues to take his Genovya as prescribed with no missed doses and no adverse side effects. Noah Perez has noted the price of his medications has increased over the past couple of months from $40 to now close to $140. 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 Noah Perez has restarted smoking. Recently obtained new dentures about 3 months ago. Has not had a colonoscopy.   2.) Hypertension - Noah Perez continues to take his amlodipine as prescribed with no adverse side effects. Does not currently check his blood pressure at home. Denies headaches, blurred or changed vision, chest pain, shortness of breath, or dyspnea with exertion.   3.) COPD - Noah Perez uses his Advair inconsistently and albuterol rarely. Denies any current symptoms of shortness of breath or wheezing. Noah Perez has restarted smoking.   Allergies  Allergen Reactions  . Oxycodone-Acetaminophen     REACTION: nausea, vomiting      Outpatient Medications Prior to Visit  Medication Sig Dispense Refill  . ADVAIR DISKUS 250-50 MCG/DOSE AEPB Inhale 1 puff into the lungs 2 (two) times daily. 60 each 4  . albuterol (PROVENTIL HFA;VENTOLIN HFA) 108 (90 Base) MCG/ACT inhaler Inhale 2 puffs into the lungs every 6 (six) hours as needed  for wheezing or shortness of breath. 1 Inhaler 6  . amLODipine (NORVASC) 10 MG tablet TAKE 1 TABLET(10 MG) BY MOUTH DAILY 30 tablet 8  . GENVOYA 150-150-200-10 MG TABS tablet TAKE 1 TABLET BY MOUTH DAILY WITH BREAKFAST 30 tablet 8   No facility-administered medications prior to visit.      Past Medical History:  Diagnosis Date  . HIV (human immunodeficiency virus infection) (HCC)   . Hypertension      History reviewed. No pertinent surgical history.   Review of Systems  Constitutional: Negative for appetite change, chills, fatigue, fever and unexpected weight change.  Eyes: Negative for visual disturbance.  Respiratory: Negative for cough, chest tightness, shortness of breath and wheezing.   Cardiovascular: Negative for chest pain and leg swelling.  Gastrointestinal: Negative for abdominal pain, constipation, diarrhea, nausea and vomiting.  Genitourinary: Negative for dysuria, flank pain, frequency, genital sores, hematuria and urgency.  Skin: Negative for rash.  Allergic/Immunologic: Negative for immunocompromised state.  Neurological: Negative for dizziness and headaches.      Objective:    BP 131/76   Pulse 64   Temp 98 F (36.7 C)   Ht 5\' 11"  (1.803 m)   Wt 141 lb (64 kg)   BMI 19.67 kg/m  Nursing note and vital signs reviewed.  Physical Exam Constitutional:      General: Noah Perez is not in acute Perez.    Appearance: Noah Perez is well-developed.  Eyes:     Conjunctiva/sclera: Conjunctivae normal.  Neck:     Musculoskeletal: Neck supple.  Cardiovascular:     Rate and Rhythm: Normal rate and regular rhythm.     Heart sounds: Normal heart sounds. No murmur. No friction rub. No gallop.   Pulmonary:     Effort: Pulmonary effort is normal. No respiratory Perez.     Breath sounds: Normal breath sounds. No wheezing or rales.  Chest:     Chest wall: No tenderness.  Abdominal:     General: Bowel sounds are normal.     Palpations: Abdomen is soft.     Tenderness: There  is no abdominal tenderness.  Lymphadenopathy:     Cervical: No cervical adenopathy.  Skin:    General: Skin is warm and dry.     Findings: No rash.  Neurological:     Mental Status: Noah Perez is alert and oriented to person, place, and time.  Psychiatric:        Behavior: Behavior normal.        Thought Content: Thought content normal.        Judgment: Judgment normal.        Assessment & Plan:   Problem List Items Addressed This Visit      Cardiovascular and Mediastinum   Essential hypertension    Blood pressure appears to be adequately controlled with the current medication of amlodipine and no adverse side effects. There are no signs of end organ damage at present. Encouraged to discontinue smoking. Monitor blood pressure as able. Continue current dose of amlodipine.       Relevant Medications   amLODipine (NORVASC) 10 MG tablet     Respiratory   COPD (chronic obstructive pulmonary disease) (HCC)    Appears to be adequately controlled at present with Advair and albuterol. No signs/symptoms of exacerbation. Encouraged tobacco cessation. Continue current dose of Advair and albuterol.         Other   Human immunodeficiency virus (HIV) disease (HCC) - Primary    HIV disease is well controlled with Genvoya with good adherence and tolerance to the medication. No signs/symptoms of opportunistic infection or progressive HIV disease noted at present. Will work to obtain co-pay card for medication assistance if possible. Check blood work and STI screening today. Continue current dose of Genvyoa. Office follow up in 1 year or sooner if needed with lab work 1-2 weeks prior to appointment.       Relevant Medications   elvitegravir-cobicistat-emtricitabine-tenofovir (GENVOYA) 150-150-200-10 MG TABS tablet   Other Relevant Orders   T-helper cell (CD4)- ([facility] clinic only)   HIV-1 RNA quant-no reflex-bld   CBC   Comprehensive metabolic panel   RPR   Lipid panel   Urine cytology ancillary  only([place])   [employer]    Initially quit smoking during previous office visit. Restarted due to work stressors. Discussed importance of tobacco cessation to reduce risk for cardiovascular, respiratory and malignant disease in the future. Noah Perez is not ready to quit smoking at present and is in the pre-contemplation stage of change. Will continue to monitor.       Healthcare maintenance     Prevnar updated today. Will need one additional dose of Pneumovax after at least 8 weeks from today as Noah Perez is now older than 65.   Declines influenza vaccination  Declines colonoscopy.   Dental screening up to date with new dentures received about 3 months ago.  Counseled on continued safe sexual practices and use of condoms to reduce risk of STI.  I have changed Noah Perez's GENVOYA to elvitegravir-cobicistat-emtricitabine-tenofovir. I am also having him maintain his albuterol, ADVAIR DISKUS, and amLODipine.   Meds ordered this encounter  Medications  . elvitegravir-cobicistat-emtricitabine-tenofovir (GENVOYA) 150-150-200-10 MG TABS tablet    Sig: Take 1 tablet by mouth daily with breakfast.    Dispense:  30 tablet    Refill:  11    Order Specific Question:   Supervising Provider    Answer:   Judyann MunsonSNIDER, CYNTHIA [[id number]]  . amLODipine (NORVASC) 10 MG tablet    Sig: TAKE 1 TABLET(10 MG) BY MOUTH DAILY    Dispense:  30 tablet    Refill:  11    Order Specific Question:   Supervising Provider    Answer:   Judyann MunsonSNIDER, CYNTHIA [[id number]]     Follow-up: Return in about 1 year (around 11/01/2019), or if symptoms worsen or fail to improve.   Marcos EkeGreg Calone, MSN, FNP-C Nurse Practitioner [facility] Office phone: [phone] Pager: [phone] [facility] Main number: [phone]

## 2018-10-31 NOTE — Assessment & Plan Note (Signed)
HIV disease is well controlled with Genvoya with good adherence and tolerance to the medication. No signs/symptoms of opportunistic infection or progressive HIV disease noted at present. Will work to obtain co-pay card for medication assistance if possible. Check blood work and STI screening today. Continue current dose of Genvyoa. Office follow up in 1 year or sooner if needed with lab work 1-2 weeks prior to appointment.

## 2018-10-31 NOTE — Assessment & Plan Note (Signed)
Blood pressure appears to be adequately controlled with the current medication of amlodipine and no adverse side effects. There are no signs of end organ damage at present. Encouraged to discontinue smoking. Monitor blood pressure as able. Continue current dose of amlodipine.

## 2018-10-31 NOTE — Patient Instructions (Signed)
Nice to meet you.  We will check your blood work today.   Please continue to take your Genvoya and amlodipine as prescribed.  I will check with the pharmacy staff regarding additional co-pay cards to lessen the price of your medications.  Plan for office follow up in 1 year or sooner if needed with lab work 1-2 weeks prior to your appointment.  Have a safe and Happy New Year!!

## 2018-10-31 NOTE — Assessment & Plan Note (Signed)
   Prevnar updated today. Will need one additional dose of Pneumovax after at least 8 weeks from today as he is now older than 65.   Declines influenza vaccination  Declines colonoscopy.   Dental screening up to date with new dentures received about 3 months ago.  Counseled on continued safe sexual practices and use of condoms to reduce risk of STI.

## 2018-10-31 NOTE — Assessment & Plan Note (Signed)
Initially quit smoking during previous office visit. Restarted due to work stressors. Discussed importance of tobacco cessation to reduce risk for cardiovascular, respiratory and malignant disease in the future. He is not ready to quit smoking at present and is in the pre-contemplation stage of change. Will continue to monitor.

## 2018-10-31 NOTE — Assessment & Plan Note (Signed)
Appears to be adequately controlled at present with Advair and albuterol. No signs/symptoms of exacerbation. Encouraged tobacco cessation. Continue current dose of Advair and albuterol.

## 2018-11-03 LAB — CBC
HEMATOCRIT: 49.9 % (ref 38.5–50.0)
Hemoglobin: 17.8 g/dL — ABNORMAL HIGH (ref 13.2–17.1)
MCH: 32.7 pg (ref 27.0–33.0)
MCHC: 35.7 g/dL (ref 32.0–36.0)
MCV: 91.6 fL (ref 80.0–100.0)
MPV: 10.3 fL (ref 7.5–12.5)
PLATELETS: 212 10*3/uL (ref 140–400)
RBC: 5.45 10*6/uL (ref 4.20–5.80)
RDW: 12.8 % (ref 11.0–15.0)
WBC: 5.9 10*3/uL (ref 3.8–10.8)

## 2018-11-03 LAB — COMPREHENSIVE METABOLIC PANEL
AG RATIO: 1.8 (calc) (ref 1.0–2.5)
ALT: 21 U/L (ref 9–46)
AST: 26 U/L (ref 10–35)
Albumin: 4.4 g/dL (ref 3.6–5.1)
Alkaline phosphatase (APISO): 88 U/L (ref 40–115)
BUN/Creatinine Ratio: 18 (calc) (ref 6–22)
BUN: 26 mg/dL — ABNORMAL HIGH (ref 7–25)
CALCIUM: 9.7 mg/dL (ref 8.6–10.3)
CO2: 28 mmol/L (ref 20–32)
Chloride: 105 mmol/L (ref 98–110)
Creat: 1.44 mg/dL — ABNORMAL HIGH (ref 0.70–1.25)
Globulin: 2.4 g/dL (calc) (ref 1.9–3.7)
Glucose, Bld: 73 mg/dL (ref 65–99)
Potassium: 3.9 mmol/L (ref 3.5–5.3)
Sodium: 140 mmol/L (ref 135–146)
Total Bilirubin: 0.7 mg/dL (ref 0.2–1.2)
Total Protein: 6.8 g/dL (ref 6.1–8.1)

## 2018-11-03 LAB — LIPID PANEL
CHOLESTEROL: 171 mg/dL (ref <200)
HDL: 57 mg/dL (ref >40)
LDL Cholesterol (Calc): 100 mg/dL (calc) — ABNORMAL HIGH
Non-HDL Cholesterol (Calc): 114 mg/dL (calc) (ref <130)
Total CHOL/HDL Ratio: 3 (calc) (ref <5.0)
Triglycerides: 56 mg/dL (ref <150)

## 2018-11-03 LAB — RPR: RPR Ser Ql: NONREACTIVE

## 2018-11-03 LAB — HIV-1 RNA QUANT-NO REFLEX-BLD
HIV 1 RNA Quant: <20 copies/mL — AB
HIV-1 RNA Quant, Log: <1.3 Log copies/mL — AB

## 2018-11-03 LAB — URINE CYTOLOGY ANCILLARY ONLY
Chlamydia: NEGATIVE
Neisseria Gonorrhea: NEGATIVE

## 2018-11-06 NOTE — Progress Notes (Signed)
PCV-13 administered on 10/31/18. Patient declines Flu vaccine at this time.

## 2018-11-06 NOTE — Addendum Note (Signed)
Addended by: Valarie Cones on: 11/06/2018 05:18 PM   Modules accepted: Orders

## 2019-06-18 ENCOUNTER — Telehealth: Payer: Self-pay

## 2019-06-18 NOTE — Telephone Encounter (Signed)
Received a call today from Ms. Delorse Lek to schedule an appointment for lab work/ office visit. Attempted to schedule appointment for December for one year follow up per last note. However, Ms. Delorse Lek states she will need to talk to patient to see if he would like to be seen in our office since he live in [place] for work or if he would prefer to find a local provider. Ms. Delorse Lek will hold off on scheduling appointment at this time and will call office back with a decision. [place]

## 2019-10-12 ENCOUNTER — Other Ambulatory Visit: Payer: Self-pay | Admitting: Family

## 2019-10-12 DIAGNOSIS — I10 Essential (primary) hypertension: Secondary | ICD-10-CM

## 2019-10-12 DIAGNOSIS — B20 Human immunodeficiency virus [HIV] disease: Secondary | ICD-10-CM

## 2019-10-12 NOTE — Telephone Encounter (Signed)
Outgoing call made to patient to schedule follow up labs/provider visit. Patient states he will not be able to come in until Feb'21 due to not being able to take off from work . Refill sent to cover through next appointment. No further refill until seen by provider.

## 2019-12-18 ENCOUNTER — Other Ambulatory Visit: Payer: BLUE CROSS/BLUE SHIELD

## 2020-01-01 ENCOUNTER — Encounter: Payer: BLUE CROSS/BLUE SHIELD | Admitting: Family

## 2020-01-04 ENCOUNTER — Encounter: Payer: Self-pay | Admitting: Family

## 2020-01-04 ENCOUNTER — Ambulatory Visit (INDEPENDENT_AMBULATORY_CARE_PROVIDER_SITE_OTHER): Payer: BLUE CROSS/BLUE SHIELD | Admitting: Family

## 2020-01-04 ENCOUNTER — Other Ambulatory Visit: Payer: Self-pay

## 2020-01-04 VITALS — BP 126/78 | HR 70 | Wt 140.0 lb

## 2020-01-04 DIAGNOSIS — Z0000 Encounter for general adult medical examination without abnormal findings: Secondary | ICD-10-CM | POA: Diagnosis not present

## 2020-01-04 DIAGNOSIS — F17200 Nicotine dependence, unspecified, uncomplicated: Secondary | ICD-10-CM

## 2020-01-04 DIAGNOSIS — Z113 Encounter for screening for infections with a predominantly sexual mode of transmission: Secondary | ICD-10-CM | POA: Diagnosis not present

## 2020-01-04 DIAGNOSIS — Z79899 Other long term (current) drug therapy: Secondary | ICD-10-CM | POA: Diagnosis not present

## 2020-01-04 DIAGNOSIS — B20 Human immunodeficiency virus [HIV] disease: Secondary | ICD-10-CM | POA: Diagnosis not present

## 2020-01-04 DIAGNOSIS — I10 Essential (primary) hypertension: Secondary | ICD-10-CM

## 2020-01-04 MED ORDER — AMLODIPINE BESYLATE 10 MG PO TABS: 10.0000 mg | ORAL_TABLET | Freq: Every day | ORAL | 11 refills | Status: DC

## 2020-01-04 MED ORDER — GENVOYA 150-150-200-10 MG PO TABS: 1.0000 | ORAL_TABLET | Freq: Every day | ORAL | 11 refills | Status: DC

## 2020-01-04 NOTE — Assessment & Plan Note (Signed)
Mr. Morones has well-controlled blood pressure and below goal 140/90 with no adverse side effects of his amlodipine.  No red flag/warning signs/symptoms.  Continue current dose of amlodipine and monitor blood pressure at home as able.

## 2020-01-04 NOTE — Assessment & Plan Note (Addendum)
   Vaccinations up-to-date per recommendations.  Discussed importance of safe sexual practice to reduce risk of STI.  Condoms declined.  Encouraged to complete dental screening/cleaning independently.

## 2020-01-04 NOTE — Progress Notes (Signed)
Subjective:    Patient ID: Noah Perez, male    DOB: 1951-07-27, 69 y.o.   MRN: [id number]  Chief Complaint  Patient presents with  . Follow-up    pt reports having questions/concerns but did not want to speak to this RN about them     HPI:  Noah Perez is a 69 y.o. male with HIV disease who was last seen in the office on 10/31/2018 with well-controlled HIV disease with good adherence and tolerance to his ART regimen of Genvoya.  Viral load at the time was undetectable and CD4 count was 400.  Healthcare maintenance due includes influenza vaccination.  Noah Perez continues to take his Genvoya as prescribed with no adverse side effects and occasional missed dose since his last office visit.  Overall doing well today with no new concerns/complaints. Denies fevers, chills, night sweats, headaches, changes in vision, neck pain/stiffness, nausea, diarrhea, vomiting, lesions or rashes.  Noah Perez remains covered through [facility] and has no problems obtaining his medication from the pharmacy which are mailed to him.  Denies feelings of being down, depressed, or hopeless recently.  No recreational or illicit drug use with current tobacco use of approximately 1/2 pack/day.  He is interested in quitting smoking and will check formulary to see what medications are covered although has failed previous medication in the past.  No current alcohol consumption.  He is sexually active and newly married in the last several months.  Considering chances of having a child in the future with his new wife.    Allergies  Allergen Reactions  . Oxycodone-Acetaminophen     REACTION: nausea, vomiting      Outpatient Medications Prior to Visit  Medication Sig Dispense Refill  . amLODipine (NORVASC) 10 MG tablet TAKE 1 TABLET(10 MG) BY MOUTH DAILY 30 tablet 2  . GENVOYA 150-150-200-10 MG TABS tablet TAKE 1 TABLET BY MOUTH DAILY WITH BREAKFAST 30 tablet 2  . ADVAIR  DISKUS 250-50 MCG/DOSE AEPB Inhale 1 puff into the lungs 2 (two) times daily. (Patient not taking: Reported on 01/04/2020) 60 each 4  . albuterol (PROVENTIL HFA;VENTOLIN HFA) 108 (90 Base) MCG/ACT inhaler Inhale 2 puffs into the lungs every 6 (six) hours as needed for wheezing or shortness of breath. (Patient not taking: Reported on 01/04/2020) 1 Inhaler 6   No facility-administered medications prior to visit.     Past Medical History:  Diagnosis Date  . HIV (human immunodeficiency virus infection) (HCC)   . Hypertension     History reviewed. No pertinent surgical history.   Review of Systems  Constitutional: Negative for appetite change, chills, fatigue, fever and unexpected weight change.  Eyes: Negative for visual disturbance.  Respiratory: Negative for cough, chest tightness, shortness of breath and wheezing.   Cardiovascular: Negative for chest pain and leg swelling.  Gastrointestinal: Negative for abdominal pain, constipation, diarrhea, nausea and vomiting.  Genitourinary: Negative for dysuria, flank pain, frequency, genital sores, hematuria and urgency.  Skin: Negative for rash.  Allergic/Immunologic: Negative for immunocompromised state.  Neurological: Negative for dizziness and headaches.      Objective:    BP 126/78   Pulse 70   Wt 140 lb (63.5 kg)   BMI 19.53 kg/m  Nursing note and vital signs reviewed.  Physical Exam Constitutional:      General: He is not in acute distress.    Appearance: He is well-developed.  Eyes:     Conjunctiva/sclera: Conjunctivae normal.  Cardiovascular:     Rate  and Rhythm: Normal rate and regular rhythm.     Heart sounds: Normal heart sounds. No murmur. No friction rub. No gallop.   Pulmonary:     Effort: Pulmonary effort is normal. No respiratory distress.     Breath sounds: Normal breath sounds. No wheezing or rales.  Chest:     Chest wall: No tenderness.  Abdominal:     General: Bowel sounds are normal.     Palpations: Abdomen  is soft.     Tenderness: There is no abdominal tenderness.  Musculoskeletal:     Cervical back: Neck supple.  Lymphadenopathy:     Cervical: No cervical adenopathy.  Skin:    General: Skin is warm and dry.     Findings: No rash.  Neurological:     Mental Status: He is alert and oriented to person, place, and time.  Psychiatric:        Behavior: Behavior normal.        Thought Content: Thought content normal.        Judgment: Judgment normal.      Depression screen [facility] 2/9 01/04/2020 10/31/2018 11/11/2017 04/15/2017 02/06/2016  Decreased Interest 0 0 0 0 0  Down, Depressed, Hopeless 0 0 0 0 0  PHQ - 2 Score 0 0 0 0 0       Assessment & Plan:    Patient Active Problem List   Diagnosis Date Noted  . Healthcare maintenance 10/31/2018  . Pain in joint, shoulder region 02/06/2016  . Arthritis of foot, 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  . Onychomycosis of toenail 11/16/2013  . COPD (chronic obstructive pulmonary disease) ([place]) 11/16/2013  . MUSCLE WEAKNESS (GENERALIZED) 12/29/2010  . INSOMNIA 12/29/2010  . PERS HX NONCOMPLIANCE W/MED TX PRS HAZARDS HLTH 12/29/2010  . TINNITUS, CHRONIC, LEFT 07/08/2009  . Human immunodeficiency virus (HIV) disease ([place]) 06/03/2009  . CIGARETTE SMOKER 06/03/2009  . Essential hypertension 06/03/2009  . DENTAL CARIES 06/03/2009  . DEGENERATIVE DISC DISEASE 06/03/2009  . PNEUMONIA, HX OF 06/03/2009  . SHINGLES, HX OF 06/03/2009  . INGUINAL HERNIORRHAPHY, LEFT, HX OF 06/03/2009     Problem List Items Addressed This Visit      Cardiovascular and Mediastinum   Essential hypertension    Noah Perez has well-controlled blood pressure and below goal 140/90 with no adverse side effects of his amlodipine.  No red flag/warning signs/symptoms.  Continue current dose of amlodipine and monitor blood pressure at home as able.      Relevant Medications   amLODipine (NORVASC) 10 MG tablet     Other   Human immunodeficiency virus (HIV) disease ([place]) - Primary    Mr.  Perez has well-controlled HIV disease with good adherence and tolerance to his ART regimen of Genvoya.  No signs/symptoms of opportunistic infection or progressive HIV disease.  Reviewed previous lab work and discussed the plan of care.  He has no problems obtaining his medication from the pharmacy.  Continue current dose of check blood work today.  He is potentially planning a move to [place] and will keep [place] posted.  Plan for follow-up in 1 year or sooner if needed with lab work on the same day.      Relevant Medications   elvitegravir-cobicistat-emtricitabine-tenofovir (GENVOYA) 150-150-200-10 MG TABS tablet   Other Relevant Orders   COMPLETE METABOLIC PANEL WITH GFR   CBC   HIV-1 RNA quant-no reflex-bld   T-helper cell (CD4)- ([facility] clinic only)   CIGARETTE SMOKER    Noah Perez continues to smoke tobacco at a rate  of approximately 1/2 pack/day and is interested in quitting.  Discussed possibilities of patches, gums, inhalers, and medications.  He will check his formulary to see what medications are covered by his insurance.  He is in the contemplation/action stage of quitting.  Discussed importance of tobacco cessation to reduce risk of cardiovascular, respiratory, and malignant disease in the future.      Healthcare maintenance     Vaccinations up-to-date per recommendations.  Discussed importance of safe sexual practice to reduce risk of STI.  Condoms declined.  Encouraged to complete dental screening/cleaning independently.       Other Visit Diagnoses    Screening for STDs (sexually transmitted diseases)       Relevant Orders   RPR   Pharmacologic therapy       Relevant Orders   Lipid panel       I have discontinued Noah Perez's albuterol and Advair Diskus. I have also changed his Genvoya and amLODipine.   Meds ordered this encounter  Medications  . elvitegravir-cobicistat-emtricitabine-tenofovir (GENVOYA) 150-150-200-10 MG TABS tablet    Sig: Take  1 tablet by mouth daily with breakfast.    Dispense:  30 tablet    Refill:  11    Order Specific Question:   Supervising Provider    Answer:   Judyann Munson [[id number]]  . amLODipine (NORVASC) 10 MG tablet    Sig: Take 1 tablet (10 mg total) by mouth daily.    Dispense:  30 tablet    Refill:  11    Order Specific Question:   Supervising Provider    Answer:   Judyann Munson [[id number]]     Follow-up: Return in about 1 year (around 01/03/2021), or if symptoms worsen or fail to improve.   Marcos Eke, MSN, FNP-C Nurse Practitioner [facility] for Infectious Disease [facility] Main number: [phone]

## 2020-01-04 NOTE — Assessment & Plan Note (Signed)
Noah Perez continues to smoke tobacco at a rate of approximately 1/2 pack/day and is interested in quitting.  Discussed possibilities of patches, gums, inhalers, and medications.  He will check his formulary to see what medications are covered by his insurance.  He is in the contemplation/action stage of quitting.  Discussed importance of tobacco cessation to reduce risk of cardiovascular, respiratory, and malignant disease in the future.

## 2020-01-04 NOTE — Assessment & Plan Note (Signed)
Noah Perez has well-controlled HIV disease with good adherence and tolerance to his ART regimen of Genvoya.  No signs/symptoms of opportunistic infection or progressive HIV disease.  Reviewed previous lab work and discussed the plan of care.  He has no problems obtaining his medication from the pharmacy.  Continue current dose of check blood work today.  He is potentially planning a move to [place] and will keep [place] posted.  Plan for follow-up in 1 year or sooner if needed with lab work on the same day.

## 2020-01-04 NOTE — Patient Instructions (Addendum)
Nice to see you.  We will check your blood work today.  Continue to take your Genvoya as prescribed daily.  Refills will be sent to the pharmacy.   Keep me posted about any changes and let me know what medications are covered on your formulary.  Plan for follow up in 1 year or sooner if needed.   Have a great day and stay safe!

## 2020-01-05 LAB — T-HELPER CELL (CD4) - (RCID CLINIC ONLY)
CD4 % Helper T Cell: 37 % (ref 33–65)
CD4 T Cell Abs: 479 /uL (ref 400–1790)

## 2020-01-07 LAB — LIPID PANEL
Cholesterol: 170 mg/dL (ref <200)
HDL: 67 mg/dL (ref >=40)
LDL Cholesterol (Calc): 85 mg/dL (calc)
Non-HDL Cholesterol (Calc): 103 mg/dL (calc) (ref <130)
Total CHOL/HDL Ratio: 2.5 (calc) (ref <5.0)
Triglycerides: 86 mg/dL (ref <150)

## 2020-01-07 LAB — CBC
HCT: 52.4 % — ABNORMAL HIGH (ref 38.5–50.0)
Hemoglobin: 18.2 g/dL — ABNORMAL HIGH (ref 13.2–17.1)
MCH: 32.6 pg (ref 27.0–33.0)
MCHC: 34.7 g/dL (ref 32.0–36.0)
MCV: 93.9 fL (ref 80.0–100.0)
MPV: 10.4 fL (ref 7.5–12.5)
Platelets: 219 10*3/uL (ref 140–400)
RBC: 5.58 10*6/uL (ref 4.20–5.80)
RDW: 12.4 % (ref 11.0–15.0)
WBC: 6.1 10*3/uL (ref 3.8–10.8)

## 2020-01-07 LAB — COMPLETE METABOLIC PANEL WITH GFR
AG Ratio: 1.8 (calc) (ref 1.0–2.5)
ALT: 15 U/L (ref 9–46)
AST: 20 U/L (ref 10–35)
Albumin: 4.4 g/dL (ref 3.6–5.1)
Alkaline phosphatase (APISO): 72 U/L (ref 35–144)
BUN: 17 mg/dL (ref 7–25)
CO2: 27 mmol/L (ref 20–32)
Calcium: 9.7 mg/dL (ref 8.6–10.3)
Chloride: 106 mmol/L (ref 98–110)
Creat: 1.18 mg/dL (ref 0.70–1.25)
GFR, Est African American: 73 mL/min/{1.73_m2} (ref >=60)
GFR, Est Non African American: 63 mL/min/{1.73_m2} (ref >=60)
Globulin: 2.4 g/dL (calc) (ref 1.9–3.7)
Glucose, Bld: 97 mg/dL (ref 65–99)
Potassium: 4.3 mmol/L (ref 3.5–5.3)
Sodium: 140 mmol/L (ref 135–146)
Total Bilirubin: 1.2 mg/dL (ref 0.2–1.2)
Total Protein: 6.8 g/dL (ref 6.1–8.1)

## 2020-01-07 LAB — HIV-1 RNA QUANT-NO REFLEX-BLD
HIV 1 RNA Quant: <20 copies/mL
HIV-1 RNA Quant, Log: <1.3 Log copies/mL

## 2020-01-07 LAB — RPR: RPR Ser Ql: NONREACTIVE

## 2020-12-28 ENCOUNTER — Other Ambulatory Visit: Payer: Self-pay | Admitting: Family

## 2020-12-28 DIAGNOSIS — I10 Essential (primary) hypertension: Secondary | ICD-10-CM

## 2021-01-27 ENCOUNTER — Encounter: Payer: Self-pay | Admitting: Family

## 2021-01-27 ENCOUNTER — Ambulatory Visit (INDEPENDENT_AMBULATORY_CARE_PROVIDER_SITE_OTHER): Payer: BLUE CROSS/BLUE SHIELD | Admitting: Family

## 2021-01-27 ENCOUNTER — Other Ambulatory Visit: Payer: Self-pay

## 2021-01-27 VITALS — BP 119/74 | HR 60 | Temp 97.5°F | Ht 71.0 in | Wt 144.0 lb

## 2021-01-27 DIAGNOSIS — F17200 Nicotine dependence, unspecified, uncomplicated: Secondary | ICD-10-CM

## 2021-01-27 DIAGNOSIS — I10 Essential (primary) hypertension: Secondary | ICD-10-CM

## 2021-01-27 DIAGNOSIS — B20 Human immunodeficiency virus [HIV] disease: Secondary | ICD-10-CM | POA: Diagnosis not present

## 2021-01-27 DIAGNOSIS — Z113 Encounter for screening for infections with a predominantly sexual mode of transmission: Secondary | ICD-10-CM

## 2021-01-27 DIAGNOSIS — Z79899 Other long term (current) drug therapy: Secondary | ICD-10-CM

## 2021-01-27 DIAGNOSIS — Z0000 Encounter for general adult medical examination without abnormal findings: Secondary | ICD-10-CM

## 2021-01-27 MED ORDER — GENVOYA 150-150-200-10 MG PO TABS: 1.0000 | ORAL_TABLET | Freq: Every day | ORAL | 11 refills | Status: DC

## 2021-01-27 NOTE — Patient Instructions (Signed)
Nice to see you.  We will check your lab work today.  Continue to take you Genvoya daily as prescribed.   Refills have been sent to the pharmacy.   Plan for follow up in 1 year or sooner if needed with lab work on the same day.   Have a great day and stay safe!

## 2021-01-27 NOTE — Assessment & Plan Note (Signed)
Mr. Dasch's blood pressure is well controlled with current dose of amlodipine. Discussed possible medication holiday to see how his blood pressure responds. He will send numbers through nursing or MyChart. Will resume medication if blood pressure is elevated.

## 2021-01-27 NOTE — Assessment & Plan Note (Signed)
Noah Perez continues to smoke approximately 1/3 pack of cigarettes per day. Discussed importance of tobacco cessation to reduce risk of cardiovascular, respiratory and malignant disease in the future. He is contemplating quitting smoking and is in the pre-contemplation stage. Discussed resources and tobacco alternative including patches, gums, and inhalers. Continue to monitor.

## 2021-01-27 NOTE — Progress Notes (Signed)
Brief Narrative   Patient ID: Noah Perez, male    DOB: Feb 21, 1951, 70 y.o.   MRN: [id number]  Noah Perez is a 70 y/o caucasian male diagnosed with HIV disease in 71. Initial viral load and CD4 count are unavailable. No history of opportunistic infection. Earliest genotype with no significant resistant patterns. ART medication includes Atripla, Stribild and now [place].   Subjective:    Chief Complaint  Patient presents with  . HIV Positive/AIDS     HPI:  Noah Perez is a 70 y.o. male with HIV disease with well controlled virus and good adherence and tolerance to his ART regimen of Genvoya. Lab work with undetectable viral load and CD4 count of 479. Here today for routine follow up.  Noah Perez continues to take his Genvoya daily as prescribed with no adverse side effects and occasional missed dose. Overall feeling well today with no new concerns/complaints. Would like to discuss possibility of stopping blood pressure medication. Denies fevers, chills, night sweats, headaches, changes in vision, neck pain/stiffness, nausea, diarrhea, vomiting, lesions or rashes.  Noah Perez has no problems obtaining medication from the pharmacy and remains covered through [facility]. Denies feelings of being down, depressed or hopeless recently. No recreational or illicit drug use and occasional alcohol consumption. Continues to smoke about 1/3 pack of cigarettes per day. Declines condoms. Has dentures and awaiting bone graft for better fitting of dentures. Continues to work and enjoys riding his motorcycle.   Allergies  Allergen Reactions  . Oxycodone-Acetaminophen     REACTION: nausea, vomiting      Outpatient Medications Prior to Visit  Medication Sig Dispense Refill  . amLODipine (NORVASC) 10 MG tablet TAKE 1 TABLET(10 MG) BY MOUTH DAILY 30 tablet 11  . elvitegravir-cobicistat-emtricitabine-tenofovir (GENVOYA) 150-150-200-10 MG TABS tablet Take  1 tablet by mouth daily with breakfast. 30 tablet 11   No facility-administered medications prior to visit.     Past Medical History:  Diagnosis Date  . HIV (human immunodeficiency virus infection) (HCC)   . Hypertension      History reviewed. No pertinent surgical history.     Review of Systems  Constitutional: Negative for appetite change, chills, fatigue, fever and unexpected weight change.  Eyes: Negative for visual disturbance.  Respiratory: Negative for cough, chest tightness, shortness of breath and wheezing.   Cardiovascular: Negative for chest pain and leg swelling.  Gastrointestinal: Negative for abdominal pain, constipation, diarrhea, nausea and vomiting.  Genitourinary: Negative for dysuria, flank pain, frequency, genital sores, hematuria and urgency.  Skin: Negative for rash.  Allergic/Immunologic: Negative for immunocompromised state.  Neurological: Negative for dizziness and headaches.      Objective:    BP 119/74   Pulse 60   Temp (!) 97.5 F (36.4 C) (Oral)   Ht 5\' 11"  (1.803 m)   Wt 144 lb (65.3 kg)   SpO2 96%   BMI 20.08 kg/m  Nursing note and vital signs reviewed.  Physical Exam Constitutional:      General: He is not in acute distress.    Appearance: He is well-developed.  Eyes:     Conjunctiva/sclera: Conjunctivae normal.  Cardiovascular:     Rate and Rhythm: Normal rate and regular rhythm.     Heart sounds: Normal heart sounds. No murmur heard. No friction rub. No gallop.   Pulmonary:     Effort: Pulmonary effort is normal. No respiratory distress.     Breath sounds: Normal breath sounds. No wheezing or rales.  Chest:  Chest wall: No tenderness.  Abdominal:     General: Bowel sounds are normal.     Palpations: Abdomen is soft.     Tenderness: There is no abdominal tenderness.  Musculoskeletal:     Cervical back: Neck supple.  Lymphadenopathy:     Cervical: No cervical adenopathy.  Skin:    General: Skin is warm and dry.      Findings: No rash.  Neurological:     Mental Status: He is alert and oriented to person, place, and time.  Psychiatric:        Behavior: Behavior normal.        Thought Content: Thought content normal.        Judgment: Judgment normal.      Depression screen [facility] 2/9 01/27/2021 01/04/2020 10/31/2018 11/11/2017 04/15/2017  Decreased Interest 0 0 0 0 0  Down, Depressed, Hopeless 0 0 0 0 0  PHQ - 2 Score 0 0 0 0 0       Assessment & Plan:    Patient Active Problem List   Diagnosis Date Noted  . Healthcare maintenance 10/31/2018  . Pain in joint, shoulder region 02/06/2016  . Arthritis of foot, 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  . Onychomycosis of toenail 11/16/2013  . COPD (chronic obstructive pulmonary disease) (HCC) 11/16/2013  . MUSCLE WEAKNESS (GENERALIZED) 12/29/2010  . INSOMNIA 12/29/2010  . PERS HX NONCOMPLIANCE W/MED TX PRS HAZARDS HLTH 12/29/2010  . TINNITUS, CHRONIC, LEFT 07/08/2009  . Human immunodeficiency virus (HIV) disease (HCC) 06/03/2009  . CIGARETTE SMOKER 06/03/2009  . Essential hypertension 06/03/2009  . DENTAL CARIES 06/03/2009  . DEGENERATIVE DISC DISEASE 06/03/2009  . PNEUMONIA, HX OF 06/03/2009  . SHINGLES, HX OF 06/03/2009  . INGUINAL HERNIORRHAPHY, LEFT, HX OF 06/03/2009     Problem List Items Addressed This Visit      Cardiovascular and Mediastinum   Essential hypertension    Noah Perez's blood pressure is well controlled with current dose of amlodipine. Discussed possible medication holiday to see how his blood pressure responds. He will send numbers through nursing or MyChart. Will resume medication if blood pressure is elevated.         Other   Human immunodeficiency virus (HIV) disease (HCC) - Primary    Noah Perez continues to have well controlled HIV disease with good adherence and tolerance to his ART regimen of Genvoya. No signs/symptoms of opportunistic infection or progressive HIV disease. Reviewed previous lab work and discussed plan of care.  Check lab work today. Continue current dose of Genvoya. Plan for follow up in 1 year or sooner if needed with lab work on the same day.       Relevant Medications   elvitegravir-cobicistat-emtricitabine-tenofovir (GENVOYA) 150-150-200-10 MG TABS tablet   Other Relevant Orders   COMPLETE METABOLIC PANEL WITH GFR   HIV-1 RNA quant-no reflex-bld   T-helper cell (CD4)- ([facility] clinic only)   CIGARETTE SMOKER    Noah Perez continues to smoke approximately 1/3 pack of cigarettes per day. Discussed importance of tobacco cessation to reduce risk of cardiovascular, respiratory and malignant disease in the future. He is contemplating quitting smoking and is in the pre-contemplation stage. Discussed resources and tobacco alternative including patches, gums, and inhalers. Continue to monitor.       Healthcare maintenance     Discussed importance of safe sexual practice to reduce the risk of STI. Condoms declined.   Has dentures with no indication for routine dental care.   Covid vaccinations up to date.  Other Visit Diagnoses    Pharmacologic therapy       Relevant Orders   Lipid panel   Screening for STDs (sexually transmitted diseases)       Relevant Orders   RPR       I am having Lenice Llamas maintain his amLODipine and Genvoya.   Meds ordered this encounter  Medications  . elvitegravir-cobicistat-emtricitabine-tenofovir (GENVOYA) 150-150-200-10 MG TABS tablet    Sig: Take 1 tablet by mouth daily with breakfast.    Dispense:  30 tablet    Refill:  11    Order Specific Question:   Supervising Provider    Answer:   Judyann Munson [[id number]]     Follow-up: Return in about 1 year (around 01/27/2022), or if symptoms worsen or fail to improve.   Marcos Eke, MSN, FNP-C Nurse Practitioner [facility] for Infectious Disease [facility] Main number: [phone]

## 2021-01-27 NOTE — Assessment & Plan Note (Signed)
   Discussed importance of safe sexual practice to reduce the risk of STI. Condoms declined.   Has dentures with no indication for routine dental care.   Covid vaccinations up to date.

## 2021-01-27 NOTE — Assessment & Plan Note (Signed)
Mr. Hornback continues to have well controlled HIV disease with good adherence and tolerance to his ART regimen of Genvoya. No signs/symptoms of opportunistic infection or progressive HIV disease. Reviewed previous lab work and discussed plan of care. Check lab work today. Continue current dose of Genvoya. Plan for follow up in 1 year or sooner if needed with lab work on the same day.

## 2021-01-30 ENCOUNTER — Telehealth: Payer: Self-pay

## 2021-01-30 LAB — COMPLETE METABOLIC PANEL WITH GFR
AG Ratio: 1.9 (calc) (ref 1.0–2.5)
ALT: 14 U/L (ref 9–46)
AST: 21 U/L (ref 10–35)
Albumin: 4.1 g/dL (ref 3.6–5.1)
Alkaline phosphatase (APISO): 76 U/L (ref 35–144)
BUN: 19 mg/dL (ref 7–25)
CO2: 25 mmol/L (ref 20–32)
Calcium: 9 mg/dL (ref 8.6–10.3)
Chloride: 105 mmol/L (ref 98–110)
Creat: 1.25 mg/dL (ref 0.70–1.25)
GFR, Est African American: 68 mL/min/{1.73_m2} (ref >=60)
GFR, Est Non African American: 58 mL/min/{1.73_m2} — ABNORMAL LOW (ref >=60)
Globulin: 2.2 g/dL (calc) (ref 1.9–3.7)
Glucose, Bld: 155 mg/dL — ABNORMAL HIGH (ref 65–99)
Potassium: 4.3 mmol/L (ref 3.5–5.3)
Sodium: 140 mmol/L (ref 135–146)
Total Bilirubin: 1.2 mg/dL (ref 0.2–1.2)
Total Protein: 6.3 g/dL (ref 6.1–8.1)

## 2021-01-30 LAB — LIPID PANEL
Cholesterol: 149 mg/dL (ref <200)
HDL: 61 mg/dL (ref >=40)
LDL Cholesterol (Calc): 74 mg/dL (calc)
Non-HDL Cholesterol (Calc): 88 mg/dL (calc) (ref <130)
Total CHOL/HDL Ratio: 2.4 (calc) (ref <5.0)
Triglycerides: 59 mg/dL (ref <150)

## 2021-01-30 LAB — HIV-1 RNA QUANT-NO REFLEX-BLD
HIV 1 RNA Quant: NOT DETECTED Copies/mL
HIV-1 RNA Quant, Log: NOT DETECTED Log cps/mL

## 2021-01-30 LAB — RPR: RPR Ser Ql: NONREACTIVE

## 2021-01-30 NOTE — Telephone Encounter (Signed)
Per lab, patient will need to have CD4 recollected. RN attempted to call patient to schedule repeat lab appointment, no answer. Will send MyChart message.   Sandie Ano, RN

## 2021-01-30 NOTE — Telephone Encounter (Signed)
RN spoke with patient, advised him that he will need to come into the office for a repeat CD4 count. Patient states he is out of town and won't be back for a few months. RN advised patient to call our office when he is back in town and we can schedule him for a lab appointment. Patient verbalized understanding and has no further questions.    Sandie Ano, RN

## 2021-01-30 NOTE — Telephone Encounter (Signed)
-----   Message from [facility] sent at 01/30/2021  4:15 PM EDT ----- Patient was retuning a call from this morning about scheduling repeat labs, patients is out of town and doesn't know when he will be back but if its something urgent to call him back in the afternoon due to work [phone]

## 2021-11-13 ENCOUNTER — Other Ambulatory Visit: Payer: Self-pay

## 2021-11-13 ENCOUNTER — Other Ambulatory Visit: Payer: BLUE CROSS/BLUE SHIELD

## 2021-11-13 DIAGNOSIS — B20 Human immunodeficiency virus [HIV] disease: Secondary | ICD-10-CM

## 2021-11-13 DIAGNOSIS — Z113 Encounter for screening for infections with a predominantly sexual mode of transmission: Secondary | ICD-10-CM

## 2021-11-13 DIAGNOSIS — Z79899 Other long term (current) drug therapy: Secondary | ICD-10-CM

## 2021-11-14 LAB — T-HELPER CELLS (CD4) COUNT (NOT AT ARMC)
CD4 % Helper T Cell: 43 % (ref 33–65)
CD4 T Cell Abs: 567 /uL (ref 400–1790)

## 2021-11-15 LAB — CBC WITH DIFFERENTIAL/PLATELET
Absolute Monocytes: 648 cells/uL (ref 200–950)
Basophils Absolute: 48 cells/uL (ref 0–200)
Basophils Relative: 0.6 %
Eosinophils Absolute: 72 cells/uL (ref 15–500)
Eosinophils Relative: 0.9 %
HCT: 50.4 % — ABNORMAL HIGH (ref 38.5–50.0)
Hemoglobin: 17.6 g/dL — ABNORMAL HIGH (ref 13.2–17.1)
Lymphs Abs: 1488 cells/uL (ref 850–3900)
MCH: 32.2 pg (ref 27.0–33.0)
MCHC: 34.9 g/dL (ref 32.0–36.0)
MCV: 92.1 fL (ref 80.0–100.0)
MPV: 10.4 fL (ref 7.5–12.5)
Monocytes Relative: 8.1 %
Neutro Abs: 5744 cells/uL (ref 1500–7800)
Neutrophils Relative %: 71.8 %
Platelets: 276 10*3/uL (ref 140–400)
RBC: 5.47 10*6/uL (ref 4.20–5.80)
RDW: 12.3 % (ref 11.0–15.0)
Total Lymphocyte: 18.6 %
WBC: 8 10*3/uL (ref 3.8–10.8)

## 2021-11-15 LAB — COMPLETE METABOLIC PANEL WITH GFR
AG Ratio: 1.7 (calc) (ref 1.0–2.5)
ALT: 13 U/L (ref 9–46)
AST: 17 U/L (ref 10–35)
Albumin: 4.3 g/dL (ref 3.6–5.1)
Alkaline phosphatase (APISO): 86 U/L (ref 35–144)
BUN/Creatinine Ratio: 14 (calc) (ref 6–22)
BUN: 19 mg/dL (ref 7–25)
CO2: 25 mmol/L (ref 20–32)
Calcium: 9.2 mg/dL (ref 8.6–10.3)
Chloride: 104 mmol/L (ref 98–110)
Creat: 1.33 mg/dL — ABNORMAL HIGH (ref 0.70–1.28)
Globulin: 2.5 g/dL (calc) (ref 1.9–3.7)
Glucose, Bld: 173 mg/dL — ABNORMAL HIGH (ref 65–99)
Potassium: 3.8 mmol/L (ref 3.5–5.3)
Sodium: 139 mmol/L (ref 135–146)
Total Bilirubin: 1 mg/dL (ref 0.2–1.2)
Total Protein: 6.8 g/dL (ref 6.1–8.1)
eGFR: 58 mL/min/{1.73_m2} — ABNORMAL LOW (ref >=60)

## 2021-11-15 LAB — LIPID PANEL
Cholesterol: 161 mg/dL (ref <200)
HDL: 61 mg/dL (ref >=40)
LDL Cholesterol (Calc): 84 mg/dL (calc)
Non-HDL Cholesterol (Calc): 100 mg/dL (calc) (ref <130)
Total CHOL/HDL Ratio: 2.6 (calc) (ref <5.0)
Triglycerides: 71 mg/dL (ref <150)

## 2021-11-15 LAB — HIV-1 RNA QUANT-NO REFLEX-BLD
HIV 1 RNA Quant: NOT DETECTED Copies/mL
HIV-1 RNA Quant, Log: NOT DETECTED Log cps/mL

## 2021-11-15 LAB — RPR: RPR Ser Ql: NONREACTIVE

## 2021-12-01 ENCOUNTER — Other Ambulatory Visit: Payer: Self-pay

## 2021-12-01 ENCOUNTER — Encounter: Payer: Self-pay | Admitting: Family

## 2021-12-01 ENCOUNTER — Telehealth: Payer: Self-pay

## 2021-12-01 ENCOUNTER — Ambulatory Visit (INDEPENDENT_AMBULATORY_CARE_PROVIDER_SITE_OTHER): Payer: BLUE CROSS/BLUE SHIELD | Admitting: Family

## 2021-12-01 ENCOUNTER — Other Ambulatory Visit (HOSPITAL_COMMUNITY): Payer: Self-pay

## 2021-12-01 ENCOUNTER — Ambulatory Visit (INDEPENDENT_AMBULATORY_CARE_PROVIDER_SITE_OTHER): Payer: BLUE CROSS/BLUE SHIELD

## 2021-12-01 DIAGNOSIS — Z79899 Other long term (current) drug therapy: Secondary | ICD-10-CM

## 2021-12-01 DIAGNOSIS — Z23 Encounter for immunization: Secondary | ICD-10-CM | POA: Diagnosis not present

## 2021-12-01 DIAGNOSIS — Z0000 Encounter for general adult medical examination without abnormal findings: Secondary | ICD-10-CM

## 2021-12-01 DIAGNOSIS — G4700 Insomnia, unspecified: Secondary | ICD-10-CM

## 2021-12-01 DIAGNOSIS — B20 Human immunodeficiency virus [HIV] disease: Secondary | ICD-10-CM

## 2021-12-01 DIAGNOSIS — I10 Essential (primary) hypertension: Secondary | ICD-10-CM | POA: Diagnosis not present

## 2021-12-01 DIAGNOSIS — Z113 Encounter for screening for infections with a predominantly sexual mode of transmission: Secondary | ICD-10-CM

## 2021-12-01 MED ORDER — GENVOYA 150-150-200-10 MG PO TABS: 1.0000 | ORAL_TABLET | Freq: Every day | ORAL | 11 refills | Status: DC

## 2021-12-01 MED ORDER — TRAZODONE HCL 50 MG PO TABS: 50.0000 mg | ORAL_TABLET | Freq: Every day | ORAL | 2 refills | Status: DC

## 2021-12-01 MED ORDER — AMLODIPINE BESYLATE 10 MG PO TABS: ORAL_TABLET | ORAL | 11 refills | Status: DC

## 2021-12-01 NOTE — Progress Notes (Signed)
Subjective:    Patient ID: Noah Perez, male    DOB: [birth year], 71 y.o.   MRN: [id number]  Chief Complaint  Patient presents with   HIV Positive/AIDS   Brief Narrative:  Mr. Hagedorn is a 71 y/o caucasian male diagnosed with HIV disease in 57. Initial viral load and CD4 count are unavailable. No history of opportunistic infection. Earliest genotype with no significant resistant patterns. ART medication includes Atripla, Stribild and now [place].   Virtual Visit via Telephone/Video Note   I connected with Mertie Clause on 12/01/2021 at 11:38 AM by telephone and verified that I am speaking with the correct person using two identifiers.   I discussed the limitations, risks, security and privacy concerns of performing an evaluation and management service by telephone and the availability of in person appointments. I also discussed with the patient that there may be a patient responsible charge related to this service. The patient expressed understanding and agreed to proceed.  Location:  Patient: Home Provider: [facility]   HPI:  ECHO TOPP is a 71 y.o. male with HIV disease last seen on 01/27/21 with well controlled virus and good adherence and tolerance to his ART regimen of Genvoya. Viral load was undetectable and CD4 count 479. Most recent lab work completed on 11/13/21 with viral load that remains undetectable and CD4 count of 567. Kidney function, liver function, and electrolytes remain within normal ranges. Telehealth visit today for routine follow up.   Mr. Amstutz continues to take his Genvoya daily as prescribed with no adverse side effects. Overall feeling well with concern for some decreased ability to sleep recently. On average he is getting 5-6 hours of sleep. Denies fevers, chills, night sweats, headaches, changes in vision, neck pain/stiffness, nausea, diarrhea, vomiting, lesions or rashes.  Mr. Leslye Peer has no problems obtaining  medication from the pharmacy. Denies feelings of being down, depressed or hopeless recently. Drinks alcohol rarely with no current recreational or illicit drug use and daily tobacco use. Healthcare maintenance due includes Pneumovax and influenza vaccines.   Allergies  Allergen Reactions   Oxycodone-Acetaminophen     REACTION: nausea, vomiting      Outpatient Medications Prior to Visit  Medication Sig Dispense Refill   amLODipine (NORVASC) 10 MG tablet TAKE 1 TABLET(10 MG) BY MOUTH DAILY 30 tablet 11   elvitegravir-cobicistat-emtricitabine-tenofovir (GENVOYA) 150-150-200-10 MG TABS tablet Take 1 tablet by mouth daily with breakfast. 30 tablet 11   No facility-administered medications prior to visit.     Past Medical History:  Diagnosis Date   HIV (human immunodeficiency virus infection) ([place])    Hypertension      History reviewed. No pertinent surgical history.     Review of Systems  Constitutional:  Negative for appetite change, chills, fatigue, fever and unexpected weight change.  Eyes:  Negative for visual disturbance.  Respiratory:  Negative for cough, chest tightness, shortness of breath and wheezing.   Cardiovascular:  Negative for chest pain and leg swelling.  Gastrointestinal:  Negative for abdominal pain, constipation, diarrhea, nausea and vomiting.  Genitourinary:  Negative for dysuria, flank pain, frequency, genital sores, hematuria and urgency.  Skin:  Negative for rash.  Allergic/Immunologic: Negative for immunocompromised state.  Neurological:  Negative for dizziness and headaches.     Objective:    Nursing note and vital signs reviewed.  Mr. Hartl is pleasant to speak with and sounds to be in no apparent distress. Physical exam limited secondary to telehealth visit.  Assessment & Plan:   Problem List Items Addressed This Visit       Cardiovascular and Mediastinum   Essential hypertension   Relevant Medications   amLODipine (NORVASC) 10  MG tablet     Other   Human immunodeficiency virus (HIV) disease ([place])    Mr. Berte has well controlled virus with good adherence and tolerance to Genvoya. No signs/symptoms of opportunistic infection. We reviewed lab work and discussed plan of care. Continue current dose of Genvoya. Will provide co-pay card for pick up. Plan for follow up in 1 year or sooner if needed with lab work 1-2 weeks prior to appointment.       Relevant Medications   elvitegravir-cobicistat-emtricitabine-tenofovir (GENVOYA) 150-150-200-10 MG TABS tablet   Other Relevant Orders   T-helper cell (CD4)- ([facility] only)   Comprehensive metabolic panel   HIV-1 RNA quant-no reflex-bld   INSOMNIA    Mr. Garringer has been getting about 5-6 hours of sleep per night on average and thinks he should be getting more. Discussed options including good sleep hygiene and medications. Will start low dose trazodone for now. Follow up in 1 month to discuss medication.       Healthcare maintenance    Discussed importance of safe sexual practice and condom use.  Scheduled for nurse visit for Pneumovax and declines influenza vaccine.  Recommended routine dental care and can refer to [facility] if needed.       Other Visit Diagnoses     Pharmacologic therapy    -  Primary   Relevant Orders   Lipid panel   Screening for STDs (sexually transmitted diseases)       Relevant Orders   RPR        I am having Mertie Clause maintain his amLODipine, Genvoya, and traZODone.   Meds ordered this encounter  Medications   DISCONTD: elvitegravir-cobicistat-emtricitabine-tenofovir (GENVOYA) 150-150-200-10 MG TABS tablet    Sig: Take 1 tablet by mouth daily with breakfast.    Dispense:  30 tablet    Refill:  11    Order Specific Question:   Supervising Provider    Answer:   Baxter Flattery, Caren Griffins [[id number]]   DISCONTD: amLODipine (NORVASC) 10 MG tablet    Sig: TAKE 1 TABLET(10 MG) BY MOUTH DAILY    Dispense:  30 tablet    Refill:   11    Order Specific Question:   Supervising Provider    Answer:   Carlyle Basques [[id number]]   DISCONTD: traZODone (DESYREL) 50 MG tablet    Sig: Take 1 tablet (50 mg total) by mouth at bedtime.    Dispense:  30 tablet    Refill:  2    Order Specific Question:   Supervising Provider    Answer:   Baxter Flattery, CYNTHIA [[id number]]   amLODipine (NORVASC) 10 MG tablet    Sig: TAKE 1 TABLET(10 MG) BY MOUTH DAILY    Dispense:  30 tablet    Refill:  11    Order Specific Question:   Supervising Provider    Answer:   Carlyle Basques [id number]   elvitegravir-cobicistat-emtricitabine-tenofovir (GENVOYA) 150-150-200-10 MG TABS tablet    Sig: Take 1 tablet by mouth daily with breakfast.    Dispense:  30 tablet    Refill:  11    Order Specific Question:   Supervising Provider    Answer:   Carlyle Basques [[id number]]   traZODone (DESYREL) 50 MG tablet    Sig: Take 1 tablet (50 mg total) by mouth at bedtime.  Dispense:  30 tablet    Refill:  2    Order Specific Question:   Supervising Provider    Answer:   Carlyle Basques [phone]     I discussed the assessment and treatment plan with the patient. The patient was provided an opportunity to ask questions and all were answered. The patient agreed with the plan and demonstrated an understanding of the instructions.   The patient was advised to call back or seek an in-person evaluation if the symptoms worsen or if the condition fails to improve as anticipated.   I provided 14  minutes of non-face-to-face time during this encounter.  Follow-up: Return in about 1 year (around 12/01/2022), or if symptoms worsen or fail to improve.   Terri Piedra, MSN, FNP-C Nurse Practitioner [facility] [employer] number: [phone]

## 2021-12-01 NOTE — Progress Notes (Signed)
Patient arrived to the office for nurse visit to receive pneumovax vaccination. Confirmed with pharmacy ok for patient to receive Prevnar 73. Patient tolerated vaccine well.  Eugenia Mcalpine

## 2021-12-01 NOTE — Patient Instructions (Signed)
Nice to see you.  Continue to take your medication daily as prescribed.  Refills have been sent to the pharmacy.  Let me know how the medication is helping with sleep or if it does.   Plan for follow up in 1 year or sooner if needed with lab work 1-2 weeks prior to appointment.   Have a great day and stay safe!

## 2021-12-01 NOTE — Assessment & Plan Note (Signed)
·   Discussed importance of safe sexual practice and condom use.   Scheduled for nurse visit for Pneumovax and declines influenza vaccine.   Recommended routine dental care and can refer to [facility] if needed.

## 2021-12-01 NOTE — Assessment & Plan Note (Signed)
Noah Perez has been getting about 5-6 hours of sleep per night on average and thinks he should be getting more. Discussed options including good sleep hygiene and medications. Will start low dose trazodone for now. Follow up in 1 month to discuss medication.

## 2021-12-01 NOTE — Telephone Encounter (Signed)
[facility] Patient Advocate Encounter °  °Was successful in obtaining a [employer] copay card for Genvoya.  This copay card will make the patients copay 0.00. ° °I have spoken with the patient.   ° ° ° ° ° °Thoren Hosang, CPhT °Specialty Pharmacy Patient Advocate °[facility] °Phone: [phone] °Fax:  [phone]  °

## 2021-12-01 NOTE — Assessment & Plan Note (Signed)
Mr. Effertz has well controlled virus with good adherence and tolerance to Genvoya. No signs/symptoms of opportunistic infection. We reviewed lab work and discussed plan of care. Continue current dose of Genvoya. Will provide co-pay card for pick up. Plan for follow up in 1 year or sooner if needed with lab work 1-2 weeks prior to appointment.

## 2022-03-22 ENCOUNTER — Other Ambulatory Visit: Payer: Self-pay | Admitting: Family

## 2022-03-22 DIAGNOSIS — I10 Essential (primary) hypertension: Secondary | ICD-10-CM

## 2022-06-28 ENCOUNTER — Telehealth: Payer: Self-pay

## 2022-06-28 NOTE — Telephone Encounter (Signed)
Received call from patient stating his Darrin Luis is now $35 at [facility] in [place], [place]. Called the pharmacy, it was not being billed through secondary coverage and is now $0. Notified patient.   Sandie Ano, RN

## 2022-08-20 ENCOUNTER — Telehealth: Payer: Self-pay

## 2022-08-20 ENCOUNTER — Other Ambulatory Visit (HOSPITAL_COMMUNITY): Payer: Self-pay

## 2022-08-20 NOTE — Telephone Encounter (Signed)
Patient called office stating pharmacy will need new copay card. Was told by pharmacist that the current card does not have any funds left on it. Spoke with Butch Penny, Pharmacy tech who states we are not able to check remainder left on card, but patient could call office if he needs a new one. Relayed this to the patient who will call if he is not able to get medication filled. Leatrice Jewels, RMA

## 2022-11-06 ENCOUNTER — Telehealth: Payer: Self-pay

## 2022-11-06 NOTE — Telephone Encounter (Signed)
Patient called, says he has been having greater difficulty breathing lately so he went to urgent care in [place] last Friday. He was told he had bronchitis and COPD. He was given a nebulizer treatment and says it worked well for him and provided relief.   He is still in [place] and would like to know if it's possible for provider to send in prescription for nebulizer machine and medication.   His insurance will cover it at [facility] (Fax: [phone]) as long as there are qualifying diagnosis and clinical codes. Will route to provider.   Beryle Flock, RN

## 2022-11-07 MED ORDER — ALBUTEROL SULFATE (2.5 MG/3ML) 0.083% IN NEBU: 2.5000 mg | INHALATION_SOLUTION | Freq: Four times a day (QID) | RESPIRATORY_TRACT | 2 refills | Status: DC | PRN

## 2022-11-07 MED ORDER — NEBULIZER MISC: 0 refills | Status: AC

## 2022-11-07 NOTE — Telephone Encounter (Signed)
Nebulizer prescription faxed to [facility]. Patient is requesting albuterol prescription be sent to:  [place] [facility] Liberty Seto, RN

## 2022-11-07 NOTE — Addendum Note (Signed)
Addended by: Mauricio Po D on: 11/07/2022 04:07 PM   Modules accepted: Orders

## 2022-11-07 NOTE — Telephone Encounter (Signed)
Received request for albuterol and nebulizer for COPD. Prescription for nebulizer and PRN albuterol sent per request.

## 2022-12-04 ENCOUNTER — Other Ambulatory Visit (HOSPITAL_COMMUNITY): Payer: Self-pay

## 2022-12-31 ENCOUNTER — Other Ambulatory Visit: Payer: Self-pay | Admitting: Family

## 2022-12-31 DIAGNOSIS — B20 Human immunodeficiency virus [HIV] disease: Secondary | ICD-10-CM

## 2023-01-11 ENCOUNTER — Other Ambulatory Visit: Payer: Self-pay

## 2023-01-11 ENCOUNTER — Other Ambulatory Visit (HOSPITAL_COMMUNITY): Payer: Self-pay

## 2023-01-11 ENCOUNTER — Ambulatory Visit (INDEPENDENT_AMBULATORY_CARE_PROVIDER_SITE_OTHER): Payer: BC Managed Care – PPO | Admitting: Family

## 2023-01-11 ENCOUNTER — Encounter: Payer: Self-pay | Admitting: Family

## 2023-01-11 VITALS — BP 144/82 | HR 76 | Temp 97.3°F | Wt 144.6 lb

## 2023-01-11 DIAGNOSIS — N1831 Chronic kidney disease, stage 3a: Secondary | ICD-10-CM

## 2023-01-11 DIAGNOSIS — M62838 Other muscle spasm: Secondary | ICD-10-CM | POA: Diagnosis not present

## 2023-01-11 DIAGNOSIS — Z79899 Other long term (current) drug therapy: Secondary | ICD-10-CM | POA: Diagnosis not present

## 2023-01-11 DIAGNOSIS — I129 Hypertensive chronic kidney disease with stage 1 through stage 4 chronic kidney disease, or unspecified chronic kidney disease: Secondary | ICD-10-CM

## 2023-01-11 DIAGNOSIS — B20 Human immunodeficiency virus [HIV] disease: Secondary | ICD-10-CM

## 2023-01-11 DIAGNOSIS — I10 Essential (primary) hypertension: Secondary | ICD-10-CM

## 2023-01-11 DIAGNOSIS — J438 Other emphysema: Secondary | ICD-10-CM | POA: Diagnosis not present

## 2023-01-11 MED ORDER — AMLODIPINE BESYLATE 10 MG PO TABS: 10.0000 mg | ORAL_TABLET | Freq: Every day | ORAL | 6 refills | Status: DC

## 2023-01-11 MED ORDER — GENVOYA 150-150-200-10 MG PO TABS: 1.0000 | ORAL_TABLET | Freq: Every day | ORAL | 6 refills | Status: DC

## 2023-01-11 MED ORDER — STIOLTO RESPIMAT 2.5-2.5 MCG/ACT IN AERS: 2.0000 | INHALATION_SPRAY | Freq: Every day | RESPIRATORY_TRACT | 6 refills | Status: DC

## 2023-01-11 MED ORDER — CYCLOBENZAPRINE HCL 5 MG PO TABS: 5.0000 mg | ORAL_TABLET | Freq: Three times a day (TID) | ORAL | 0 refills | Status: DC | PRN

## 2023-01-11 NOTE — Assessment & Plan Note (Signed)
Nashid is having to use his albuterol more frequently and experiencing increased shortness of breath. Previously on Advair however, there is drug interaction with is Genvoya. Will start Stiolto to see if this improves his symptoms. May need pulmonary function testing and referral to Pulmonology if symptoms do not improve with addition of Stiolto.

## 2023-01-11 NOTE — Assessment & Plan Note (Signed)
Blood pressure mildly elevated about goal of <130/<80 with no neurological/ophthalmological signs/symptoms. Tolerating amlodipine with no adverse side effects. Continue current dose of amlodipine and monitor blood pressure at home as able.

## 2023-01-11 NOTE — Patient Instructions (Addendum)
Nice to see you.  We will check your lab work today.  Continue to take your medication daily as prescribed.  Refills have been sent to the pharmacy.  Plan for follow up in 1 year or sooner if needed with lab work on the same day.  Have a great day and stay safe!   Cervical Strain and Sprain Rehab Ask your health care provider which exercises are safe for you. Do exercises exactly as told by your health care provider and adjust them as directed. It is normal to feel mild stretching, pulling, tightness, or discomfort as you do these exercises. Stop right away if you feel sudden pain or your pain gets worse. Do not begin these exercises until told by your health care provider. Stretching and range-of-motion exercises Cervical side bending  Using good posture, sit on a stable chair or stand up. Without moving your shoulders, slowly tilt your left / right ear to your shoulder until you feel a stretch in the neck muscles on the opposite side. You should be looking straight ahead. Hold for __________ seconds. Repeat with the other side of your neck. Repeat __________ times. Complete this exercise __________ times a day. Cervical rotation  Using good posture, sit on a stable chair or stand up. Slowly turn your head to the side as if you are looking over your left / right shoulder. Keep your eyes level with the ground. Stop when you feel a stretch along the side and the back of your neck. Hold for __________ seconds. Repeat this by turning to your other side. Repeat __________ times. Complete this exercise __________ times a day. Thoracic extension and pectoral stretch  Roll a towel or a small blanket so it is about 4 inches (10 cm) in diameter. Lie down on your back on a firm surface. Put the towel in the middle of your back across your spine. It should not be under your shoulder blades. Put your hands behind your head and let your elbows fall out to your sides. Hold for __________  seconds. Repeat __________ times. Complete this exercise __________ times a day. Strengthening exercises Upper cervical flexion  Lie on your back with a thin pillow behind your head or a small, rolled-up towel under your neck. Gently tuck your chin toward your chest and nod your head down to look toward your feet. Do not lift your head off the pillow. Hold for __________ seconds. Release the tension slowly. Relax your neck muscles completely before you repeat this exercise. Repeat __________ times. Complete this exercise __________ times a day. Cervical extension  Stand about 6 inches (15 cm) away from a wall, with your back facing the wall. Place a soft object, about 6-8 inches (15-20 cm) in diameter, between the back of your head and the wall. A soft object could be a small pillow, a ball, or a folded towel. Gently tilt your head back and press into the soft object. Keep your jaw and forehead relaxed. Hold for __________ seconds. Release the tension slowly. Relax your neck muscles completely before you repeat this exercise. Repeat __________ times. Complete this exercise __________ times a day. Posture and body mechanics Body mechanics refer to the movements and positions of your body while you do your daily activities. Posture is part of body mechanics. Good posture and healthy body mechanics can help to relieve stress in your body's tissues and joints. Good posture means that your spine is in its natural S-curve position (your spine is neutral), your shoulders  are pulled back slightly, and your head is not tipped forward. The following are general guidelines for using improved posture and body mechanics in your everyday activities. Sitting  When sitting, keep your spine neutral and keep your feet flat on the floor. Use a footrest, if needed, and keep your thighs parallel to the floor. Avoid rounding your shoulders. Avoid tilting your head forward. When working at a desk or a computer,  keep your desk at a height where your hands are slightly lower than your elbows. Slide your chair under your desk so you are close enough to maintain good posture. When working at a computer, place your monitor at a height where you are looking straight ahead and you do not have to tilt your head forward or downward to look at the screen. Standing  When standing, keep your spine neutral and keep your feet about hip-width apart. Keep a slight bend in your knees. Your ears, shoulders, and hips should line up. When you do a task in which you stand in one place for a long time, place one foot up on a stable object that is 2-4 inches (5-10 cm) high, such as a footstool. This helps keep your spine neutral. Resting When lying down and resting, avoid positions that are most painful for you. Try to support your neck in a neutral position. You can use a contour pillow or a small rolled-up towel. Your pillow should support your neck but not push on it. This information is not intended to replace advice given to you by your health care provider. Make sure you discuss any questions you have with your health care provider. Document Revised: 05/14/2022 Document Reviewed: 05/14/2022 Elsevier Patient Education  [employer].

## 2023-01-11 NOTE — Progress Notes (Addendum)
Brief Narrative   Patient ID: Noah Perez, male    DOB: 09/28/1951, 72 y.o.   MRN: [id number]  Mr. Moscatelli is a 72 y/o caucasian male diagnosed with HIV disease in 79. Initial viral load and CD4 count are unavailable. No history of opportunistic infection. Earliest genotype with no significant resistant patterns. ART medication includes Atripla, Stribild and now [place].    Subjective:    Chief Complaint  Patient presents with   Follow-up    Neck/ shoulder pain/ needs refills    HPI:  Noah Perez is a 72 y.o. male with HIV disease last seen on 12/01/21 with well controlled virus and good adherence and tolerance to Genvoya. Viral load was undetectable and CD4 count 567. Renal function (eGFR 58), hepatic function and electrolytes within normal ranges. HEre today for routine follow up.  [name] has been doing okay since his last office visit however has been having neck stiffness and discomfort on the left side for about 1 month that started after riding his motorcycle and has been refractory to OTC ibuprofen/acetaminophen and electric heat packs. Limited range of motion with left rotation and lateral bending. No numbness/tingling into the left upper extremity.  Continues to take Genvoya as prescribed with no adverse side effects. Has been using his albuterol every few days and feels shortness of breath at times. Condoms and STD testing offered.   Denies fevers, chills, night sweats, headaches, changes in vision, neck pain/stiffness, nausea, diarrhea, vomiting, lesions or rashes.  Allergies  Allergen Reactions   Oxycodone-Acetaminophen     REACTION: nausea, vomiting      Outpatient Medications Prior to Visit  Medication Sig Dispense Refill   albuterol (PROVENTIL) (2.5 MG/3ML) 0.083% nebulizer solution Take 3 mLs (2.5 mg total) by nebulization every 6 (six) hours as needed for wheezing or shortness of breath. 75 mL 2   Nebulizer MISC Nebulizer machine and  age-appropriate nebulizer accessories (Mask, Adapter, Tubing, Filter, Mesh Cup & Medication Cup) and other supplies as needed for administration of inhaled pulmonary medication. May substitute for comparable unit per insurance formulary 1 each 0   amLODipine (NORVASC) 10 MG tablet TAKE 1 TABLET(10 MG) BY MOUTH DAILY 30 tablet 11   elvitegravir-cobicistat-emtricitabine-tenofovir (GENVOYA) 150-150-200-10 MG TABS tablet Take 1 tablet by mouth daily with breakfast. 30 tablet 11   traZODone (DESYREL) 50 MG tablet Take 1 tablet (50 mg total) by mouth at bedtime. (Patient not taking: Reported on 01/11/2023) 30 tablet 2   No facility-administered medications prior to visit.     Past Medical History:  Diagnosis Date   HIV (human immunodeficiency virus infection) ([place])    Hypertension      History reviewed. No pertinent surgical history.    Review of Systems  Constitutional:  Negative for appetite change, chills, fatigue, fever and unexpected weight change.  Eyes:  Negative for visual disturbance.  Respiratory:  Negative for cough, chest tightness, shortness of breath and wheezing.   Cardiovascular:  Negative for chest pain and leg swelling.  Gastrointestinal:  Negative for abdominal pain, constipation, diarrhea, nausea and vomiting.  Genitourinary:  Negative for dysuria, flank pain, frequency, genital sores, hematuria and urgency.  Musculoskeletal:  Positive for neck pain.  Skin:  Negative for rash.  Allergic/Immunologic: Negative for immunocompromised state.  Neurological:  Negative for dizziness and headaches.      Objective:    BP (!) 144/82   Pulse 76   Temp (!) 97.3 F (36.3 C) (Oral)   Wt 144 lb 9.6  oz (65.6 kg)   SpO2 94%   BMI 20.17 kg/m  Nursing note and vital signs reviewed.  Physical Exam Constitutional:      General: He is not in acute distress.    Appearance: He is well-developed.  Eyes:     Conjunctiva/sclera: Conjunctivae normal.  Neck:     Comments: No obvious  deformity, discoloration or edema. There is tenderness and tightness of the left upper trapezius. Range of motion limited in left rotation and lateral bending. Distal pulses and sensation are intact.  Cardiovascular:     Rate and Rhythm: Normal rate and regular rhythm.     Heart sounds: Normal heart sounds. No murmur heard.    No friction rub. No gallop.  Pulmonary:     Effort: Pulmonary effort is normal. No respiratory distress.     Breath sounds: Normal breath sounds. No wheezing or rales.  Chest:     Chest wall: No tenderness.  Abdominal:     General: Bowel sounds are normal.     Palpations: Abdomen is soft.     Tenderness: There is no abdominal tenderness.  Musculoskeletal:     Cervical back: Neck supple.  Lymphadenopathy:     Cervical: No cervical adenopathy.  Skin:    General: Skin is warm and dry.     Findings: No rash.  Neurological:     Mental Status: He is alert and oriented to person, place, and time.  Psychiatric:        Behavior: Behavior normal.        Thought Content: Thought content normal.        Judgment: Judgment normal.         12/01/2021   11:03 AM 01/27/2021   11:40 AM 01/04/2020    9:16 AM 10/31/2018    8:58 AM 11/11/2017   10:35 AM  Depression screen PHQ 2/9  Decreased Interest 0 0 0 0 0  Down, Depressed, Hopeless 0 0 0 0 0  PHQ - 2 Score 0 0 0 0 0       Assessment & Plan:    Patient Active Problem List   Diagnosis Date Noted   Neck muscle spasm 01/11/2023   Stage 3a chronic kidney disease ([place]) 01/11/2023   Healthcare maintenance 10/31/2018   Pain in joint, shoulder region 02/06/2016   Arthritis of foot, 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   Onychomycosis of toenail 11/16/2013   COPD (chronic obstructive pulmonary disease) ([place]) 11/16/2013   MUSCLE WEAKNESS (GENERALIZED) 12/29/2010   INSOMNIA 12/29/2010   PERS HX NONCOMPLIANCE W/MED TX PRS HAZARDS HLTH 12/29/2010   TINNITUS, CHRONIC, LEFT 07/08/2009   Human immunodeficiency virus (HIV) disease ([place])  06/03/2009   CIGARETTE SMOKER 06/03/2009   Essential hypertension 06/03/2009   DENTAL CARIES 06/03/2009   DEGENERATIVE [place] DISEASE 06/03/2009   PNEUMONIA, HX OF 06/03/2009   SHINGLES, HX OF 06/03/2009   INGUINAL HERNIORRHAPHY, LEFT, HX OF 06/03/2009     Problem List Items Addressed This Visit       Cardiovascular and Mediastinum   Essential hypertension    Blood pressure mildly elevated about goal of <130/<80 with no neurological/ophthalmological signs/symptoms. Tolerating amlodipine with no adverse side effects. Continue current dose of amlodipine and monitor blood pressure at home as able.       Relevant Medications   amLODipine (NORVASC) 10 MG tablet     Respiratory   COPD (chronic obstructive pulmonary disease) (HCC)    Matthew is having to use his albuterol more frequently and experiencing increased shortness of  breath. Previously on Advair however, there is drug interaction with is Genvoya. Will start Stiolto to see if this improves his symptoms. May need pulmonary function testing and referral to Pulmonology if symptoms do not improve with addition of Stiolto.       Relevant Medications   Tiotropium Bromide-Olodaterol (STIOLTO RESPIMAT) 2.5-2.5 MCG/ACT AERS     Musculoskeletal and Integument   Neck muscle spasm    Breighton has symptoms that are consistent with muscle spasm likely of the left upper trapezius. Recommend moist heat x 20 minutes every 2 hours and home exercise therapy. Will start low dose Flexeril as needed. Advised the medication may cause sleepiness/drowsiness and to avoid operating any heavy machinary or driving until he knows how the medication will effect him. May need physical therapy if symptoms do not improve with home care regimen.         Genitourinary   Stage 3a chronic kidney disease ([place])    Rajah has chronic kidney disease 3a with lab work with eGFR of 58. Continue to control co-morbid conditions. Has quit smoking. Will consider changing Genvoya  but unlikely contributing to this at the current time.         Other   Human immunodeficiency virus (HIV) disease ([place]) - Primary    Taeo continues to have well controlled virus with good adherence and tolerance to Genvoya. Reviewed lab work and discussed plan of care. May need to consider changing Genvoya in the future pending need for COPD treatment. Continue current dose of Genovya. Plan for follow up in 1 year or sooner if needed.       Relevant Medications   elvitegravir-cobicistat-emtricitabine-tenofovir (GENVOYA) 150-150-200-10 MG TABS tablet   Other Relevant Orders   COMPLETE METABOLIC PANEL WITH GFR   HIV-1 RNA quant-no reflex-bld   T-helper cells (CD4) count (not at [facility])   Other Visit Diagnoses     Pharmacologic therapy       Relevant Orders   Lipid panel        I have changed Araceli Bouche K. Colglazier's amLODipine. I am also having him start on cyclobenzaprine and Stiolto Respimat. Additionally, I am having him maintain his traZODone, Nebulizer, albuterol, and Genvoya.   Meds ordered this encounter  Medications   elvitegravir-cobicistat-emtricitabine-tenofovir (GENVOYA) 150-150-200-10 MG TABS tablet    Sig: Take 1 tablet by mouth daily with breakfast.    Dispense:  30 tablet    Refill:  6    Order Specific Question:   Supervising Provider    Answer:   Baxter Flattery, CYNTHIA [[id number]]   amLODipine (NORVASC) 10 MG tablet    Sig: Take 1 tablet (10 mg total) by mouth daily.    Dispense:  30 tablet    Refill:  6    Order Specific Question:   Supervising Provider    Answer:   Baxter Flattery, CYNTHIA [[id number]]   cyclobenzaprine (FLEXERIL) 5 MG tablet    Sig: Take 1 tablet (5 mg total) by mouth 3 (three) times daily as needed for muscle spasms.    Dispense:  30 tablet    Refill:  0    Order Specific Question:   Supervising Provider    Answer:   Baxter Flattery, CYNTHIA [[id number]]   Tiotropium Bromide-Olodaterol (STIOLTO RESPIMAT) 2.5-2.5 MCG/ACT AERS    Sig: Inhale 2 puffs into the lungs daily.     Dispense:  4 g    Refill:  6    Order Specific Question:   Supervising Provider    Answer:   Carlyle Basques [  [id number]]     Follow-up: 1 year or sooner if needed for HIV.    Terri Piedra, MSN, FNP-C Nurse Practitioner [facility] [place] number: [phone]

## 2023-01-11 NOTE — Assessment & Plan Note (Signed)
Judd has chronic kidney disease 3a with lab work with eGFR of 58. Continue to control co-morbid conditions. Has quit smoking. Will consider changing Genvoya but unlikely contributing to this at the current time.

## 2023-01-11 NOTE — Assessment & Plan Note (Signed)
Noah Perez has symptoms that are consistent with muscle spasm likely of the left upper trapezius. Recommend moist heat x 20 minutes every 2 hours and home exercise therapy. Will start low dose Flexeril as needed. Advised the medication may cause sleepiness/drowsiness and to avoid operating any heavy machinary or driving until he knows how the medication will effect him. May need physical therapy if symptoms do not improve with home care regimen.

## 2023-01-11 NOTE — Assessment & Plan Note (Signed)
Laguan continues to have well controlled virus with good adherence and tolerance to Genvoya. Reviewed lab work and discussed plan of care. May need to consider changing Genvoya in the future pending need for COPD treatment. Continue current dose of Genovya. Plan for follow up in 1 year or sooner if needed.

## 2023-01-15 LAB — COMPLETE METABOLIC PANEL WITH GFR
AG Ratio: 2.1 (calc) (ref 1.0–2.5)
ALT: 16 U/L (ref 9–46)
AST: 19 U/L (ref 10–35)
Albumin: 4.6 g/dL (ref 3.6–5.1)
Alkaline phosphatase (APISO): 83 U/L (ref 35–144)
BUN/Creatinine Ratio: 15 (calc) (ref 6–22)
BUN: 20 mg/dL (ref 7–25)
CO2: 26 mmol/L (ref 20–32)
Calcium: 9.5 mg/dL (ref 8.6–10.3)
Chloride: 106 mmol/L (ref 98–110)
Creat: 1.32 mg/dL — ABNORMAL HIGH (ref 0.70–1.28)
Globulin: 2.2 g/dL (calc) (ref 1.9–3.7)
Glucose, Bld: 86 mg/dL (ref 65–99)
Potassium: 4.3 mmol/L (ref 3.5–5.3)
Sodium: 141 mmol/L (ref 135–146)
Total Bilirubin: 0.9 mg/dL (ref 0.2–1.2)
Total Protein: 6.8 g/dL (ref 6.1–8.1)
eGFR: 58 mL/min/{1.73_m2} — ABNORMAL LOW (ref >=60)

## 2023-01-15 LAB — LIPID PANEL
Cholesterol: 185 mg/dL (ref <200)
HDL: 67 mg/dL (ref >=40)
LDL Cholesterol (Calc): 100 mg/dL (calc) — ABNORMAL HIGH
Non-HDL Cholesterol (Calc): 118 mg/dL (calc) (ref <130)
Total CHOL/HDL Ratio: 2.8 (calc) (ref <5.0)
Triglycerides: 86 mg/dL (ref <150)

## 2023-01-15 LAB — T-HELPER CELLS (CD4) COUNT (NOT AT ARMC)
Absolute CD4: 571 cells/uL (ref 490–1740)
CD4 T Helper %: 47 % (ref 30–61)
Total lymphocyte count: 1202 cells/uL (ref 850–3900)

## 2023-01-15 LAB — HIV-1 RNA QUANT-NO REFLEX-BLD
HIV 1 RNA Quant: NOT DETECTED Copies/mL
HIV-1 RNA Quant, Log: NOT DETECTED Log cps/mL

## 2023-04-05 ENCOUNTER — Other Ambulatory Visit: Payer: Self-pay | Admitting: Family

## 2023-04-05 DIAGNOSIS — I10 Essential (primary) hypertension: Secondary | ICD-10-CM

## 2023-05-02 ENCOUNTER — Telehealth: Payer: Self-pay

## 2023-05-02 ENCOUNTER — Other Ambulatory Visit (HOSPITAL_COMMUNITY): Payer: Self-pay

## 2023-05-02 NOTE — Telephone Encounter (Signed)
Thank you, called patient and made his aware.

## 2023-05-02 NOTE — Telephone Encounter (Signed)
Patient called stating his co-pay is now $35 and he has "never paid this amount" for his medication. Routing to pharmacy tech as patient made need co-pay assistant card for [employer]. Routing to pharmacy for assistance.  Valarie Cones, LPN

## 2023-08-06 ENCOUNTER — Other Ambulatory Visit: Payer: Self-pay

## 2023-08-06 DIAGNOSIS — B20 Human immunodeficiency virus [HIV] disease: Secondary | ICD-10-CM

## 2023-08-06 DIAGNOSIS — I10 Essential (primary) hypertension: Secondary | ICD-10-CM

## 2023-08-06 MED ORDER — AMLODIPINE BESYLATE 10 MG PO TABS: 10.0000 mg | ORAL_TABLET | Freq: Every day | ORAL | 4 refills | Status: DC

## 2023-08-06 MED ORDER — GENVOYA 150-150-200-10 MG PO TABS: 1.0000 | ORAL_TABLET | Freq: Every day | ORAL | 4 refills | Status: DC

## 2023-08-07 NOTE — Progress Notes (Signed)
The 10-year ASCVD risk score (Arnett DK, et al., 2019) is: 23.1%   Values used to calculate the score:     Age: 72 years     Sex: Male     Is Non-Hispanic African American: No     Diabetic: No     Tobacco smoker: No     Systolic Blood Pressure: 144 mmHg     Is BP treated: Yes     HDL Cholesterol: 67 mg/dL     Total Cholesterol: 185 mg/dL  Sandie Ano, RN

## 2023-09-26 ENCOUNTER — Other Ambulatory Visit (HOSPITAL_COMMUNITY): Payer: Self-pay

## 2023-11-04 ENCOUNTER — Ambulatory Visit: Payer: BC Managed Care – PPO | Admitting: Family

## 2023-11-29 ENCOUNTER — Ambulatory Visit: Payer: BC Managed Care – PPO | Admitting: Family

## 2023-12-27 ENCOUNTER — Other Ambulatory Visit: Payer: Self-pay

## 2023-12-27 ENCOUNTER — Ambulatory Visit (INDEPENDENT_AMBULATORY_CARE_PROVIDER_SITE_OTHER): Payer: BC Managed Care – PPO | Admitting: Family

## 2023-12-27 ENCOUNTER — Encounter: Payer: Self-pay | Admitting: Family

## 2023-12-27 ENCOUNTER — Other Ambulatory Visit (HOSPITAL_COMMUNITY): Payer: Self-pay

## 2023-12-27 VITALS — BP 136/81 | HR 66 | Ht 71.0 in | Wt 143.0 lb

## 2023-12-27 DIAGNOSIS — B20 Human immunodeficiency virus [HIV] disease: Secondary | ICD-10-CM

## 2023-12-27 DIAGNOSIS — M25511 Pain in right shoulder: Secondary | ICD-10-CM

## 2023-12-27 DIAGNOSIS — I129 Hypertensive chronic kidney disease with stage 1 through stage 4 chronic kidney disease, or unspecified chronic kidney disease: Secondary | ICD-10-CM | POA: Diagnosis not present

## 2023-12-27 DIAGNOSIS — I10 Essential (primary) hypertension: Secondary | ICD-10-CM

## 2023-12-27 DIAGNOSIS — Z0000 Encounter for general adult medical examination without abnormal findings: Secondary | ICD-10-CM

## 2023-12-27 DIAGNOSIS — Z87891 Personal history of nicotine dependence: Secondary | ICD-10-CM

## 2023-12-27 DIAGNOSIS — J449 Chronic obstructive pulmonary disease, unspecified: Secondary | ICD-10-CM | POA: Diagnosis not present

## 2023-12-27 DIAGNOSIS — J438 Other emphysema: Secondary | ICD-10-CM

## 2023-12-27 DIAGNOSIS — N1831 Chronic kidney disease, stage 3a: Secondary | ICD-10-CM | POA: Diagnosis not present

## 2023-12-27 MED ORDER — DOVATO 50-300 MG PO TABS: 1.0000 | ORAL_TABLET | Freq: Every day | ORAL | 5 refills | Status: DC

## 2023-12-27 MED ORDER — ALBUTEROL SULFATE (2.5 MG/3ML) 0.083% IN NEBU
2.5000 mg | INHALATION_SOLUTION | Freq: Four times a day (QID) | RESPIRATORY_TRACT | 2 refills | Status: DC | PRN
Start: 2023-12-27 — End: 2024-06-26

## 2023-12-27 MED ORDER — AMLODIPINE BESYLATE 10 MG PO TABS: 10.0000 mg | ORAL_TABLET | Freq: Every day | ORAL | 4 refills | Status: DC

## 2023-12-27 NOTE — Patient Instructions (Addendum)
Nice to see you. ° °Continue to take your medication daily as prescribed. ° °Refills have been sent to the pharmacy. ° °Plan for follow up in 6 months or sooner if needed with lab work on the same day. ° °Have a great day and stay safe! ° °

## 2023-12-27 NOTE — Assessment & Plan Note (Addendum)
 Blood pressure adequately controlled with current dose of amlodipine.  Discussed importance of blood pressure control especially in the setting of chronic kidney disease stage IIIa and protecting kidney function as well as prevention of cardiovascular disease in the future.  Encouraged to monitor blood pressures at home and follow low-sodium diet.  Continue current dose of amlodipine.

## 2023-12-27 NOTE — Progress Notes (Signed)
 Brief Narrative   Patient ID: CADELL GABRIELSON, male    DOB: October 08, 1951, 73 y.o.   MRN: [id number]  Mr. Curlin is a 73 y/o caucasian male diagnosed with HIV disease in 58. Initial viral load and CD4 count are unavailable. No history of opportunistic infection. Earliest genotype with no significant resistant patterns. ART medication includes Atripla, Stribild and now [place].    Subjective:    Chief Complaint  Patient presents with   Follow-up    B20    HPI:  THEOPOLIS SLOOP is a 73 y.o. male with HIV disease last seen on 01/11/2023 with well-controlled virus and good adherence and tolerance to Genvoya.  Viral load was undetectable and CD4 count 567.  Renal function is consistent with chronic kidney disease stage IIIa with estimated GFR of 58.  Concern for worsening COPD and was prescribed Stiolto in addition to as needed albuterol.  Blood pressure is adequately controlled with amlodipine.  Here today for routine follow-up.  Mr. Lavena Stanford has been doing okay since his last office visit and continues to take medications as prescribed with no adverse side effects.  Covered by [employer].  Does have some issues obtaining medication from the pharmacy having to contact them and requesting the use of a co-pay card to assist with payments.  Unfortunately this is every month.  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 Since that time pain has waxed and waned and has been using icy/hot at the recommendation of medical team that he spoke with. Has not had any further evaluation and continues to have issues with active abduction and internal rotation/adduction. Housing and food access are stable. Transportation via personal vehicle. Working full time. Condoms and site-specific STD testing offered. Healthcare maintenance  reviewed.   Denies fevers, chills, night sweats, headaches, changes in vision, neck pain/stiffness, nausea, diarrhea, vomiting, lesions or rashes.  Lab Results  Component Value Date   CD4TCELL 47 01/11/2023   CD4TABS 567 11/13/2021   Lab Results  Component Value Date   HIV1RNAQUANT Not Detected 01/11/2023     Allergies  Allergen Reactions   Oxycodone-Acetaminophen     REACTION: nausea, vomiting      Outpatient Medications Prior to Visit  Medication Sig Dispense Refill   cyclobenzaprine (FLEXERIL) 5 MG tablet Take 1 tablet (5 mg total) by mouth 3 (three) times daily as needed for muscle spasms. 30 tablet 0   Nebulizer MISC Nebulizer machine and age-appropriate nebulizer accessories (Mask, Adapter, Tubing, Filter, Mesh Cup & Medication Cup) and other supplies as needed for administration of inhaled pulmonary medication. May substitute for comparable unit per insurance formulary 1 each 0   albuterol (PROVENTIL) (2.5 MG/3ML) 0.083% nebulizer solution Take 3 mLs (2.5 mg total) by nebulization every 6 (six) hours as needed for wheezing or shortness of breath. 75 mL 2   amLODipine (NORVASC) 10 MG tablet Take 1 tablet (10 mg total) by mouth daily. 30 tablet 4   elvitegravir-cobicistat-emtricitabine-tenofovir (GENVOYA) 150-150-200-10 MG TABS tablet Take 1 tablet by mouth daily with breakfast. 30 tablet 4   Tiotropium Bromide-Olodaterol (STIOLTO RESPIMAT) 2.5-2.5 MCG/ACT AERS Inhale 2 puffs into the lungs daily. 4 g 6   traZODone (DESYREL) 50 MG tablet Take 1 tablet (50 mg total) by mouth at bedtime. (Patient not taking: Reported on 12/27/2023) 30 tablet 2   No facility-administered medications  prior to visit.     Past Medical History:  Diagnosis Date   HIV (human immunodeficiency virus infection) (HCC)    Hypertension      History reviewed. No pertinent surgical history.    Review of Systems  Constitutional:  Negative for appetite change, chills, fatigue, fever and unexpected  weight change.  Eyes:  Negative for visual disturbance.  Respiratory:  Negative for cough, chest tightness, shortness of breath and wheezing.   Cardiovascular:  Negative for chest pain and leg swelling.  Gastrointestinal:  Negative for abdominal pain, constipation, diarrhea, nausea and vomiting.  Genitourinary:  Negative for dysuria, flank pain, frequency, genital sores, hematuria and urgency.  Skin:  Negative for rash.  Allergic/Immunologic: Negative for immunocompromised state.  Neurological:  Negative for dizziness and headaches.      Objective:    BP 136/81   Pulse 66   Ht 5\' 11"  (1.803 m)   Wt 143 lb (64.9 kg)   BMI 19.94 kg/m  Nursing note and vital signs reviewed.  Physical Exam Constitutional:      General: He is not in acute distress.    Appearance: He is well-developed.  Eyes:     Conjunctiva/sclera: Conjunctivae normal.  Cardiovascular:     Rate and Rhythm: Normal rate and regular rhythm.     Heart sounds: Normal heart sounds. No murmur heard.    No friction rub. No gallop.  Pulmonary:     Effort: Pulmonary effort is normal. No respiratory distress.     Breath sounds: Normal breath sounds. No wheezing or rales.  Chest:     Chest wall: No tenderness.  Abdominal:     General: Bowel sounds are normal.     Palpations: Abdomen is soft.     Tenderness: There is no abdominal tenderness.  Musculoskeletal:     Cervical back: Neck supple.  Lymphadenopathy:     Cervical: No cervical adenopathy.  Skin:    General: Skin is warm and dry.     Findings: No rash.  Neurological:     Mental Status: He is alert and oriented to person, place, and time.  Psychiatric:        Behavior: Behavior normal.        Thought Content: Thought content normal.        Judgment: Judgment normal.         12/27/2023   11:06 AM 12/01/2021   11:03 AM 01/27/2021   11:40 AM 01/04/2020    9:16 AM 10/31/2018    8:58 AM  Depression screen PHQ 2/9  Decreased Interest 0 0 0 0 0  Down,  Depressed, Hopeless 0 0 0 0 0  PHQ - 2 Score 0 0 0 0 0       Assessment & Plan:    Patient Active Problem List   Diagnosis Date Noted   Neck muscle spasm 01/11/2023   Stage 3a chronic kidney disease (HCC) 01/11/2023   Healthcare maintenance 10/31/2018   Pain in joint, shoulder region 02/06/2016   Arthritis of foot, 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   Onychomycosis of toenail 11/16/2013   COPD (chronic obstructive pulmonary disease) (HCC) 11/16/2013   MUSCLE WEAKNESS (GENERALIZED) 12/29/2010   INSOMNIA 12/29/2010   PERS HX NONCOMPLIANCE W/MED TX PRS HAZARDS HLTH 12/29/2010   TINNITUS, CHRONIC, LEFT 07/08/2009   Human immunodeficiency virus (HIV) disease (HCC) 06/03/2009   CIGARETTE SMOKER 06/03/2009   Essential hypertension 06/03/2009   DENTAL CARIES 06/03/2009   DEGENERATIVE [place] DISEASE 06/03/2009   PNEUMONIA, HX OF 06/03/2009  SHINGLES, HX OF 06/03/2009   INGUINAL HERNIORRHAPHY, LEFT, HX OF 06/03/2009     Problem List Items Addressed This Visit       Cardiovascular and Mediastinum   Essential hypertension   Blood pressure adequately controlled with current dose of amlodipine.  Discussed importance of blood pressure control especially in the setting of chronic kidney disease stage IIIa and protecting kidney function as well as prevention of cardiovascular disease in the future.  Encouraged to monitor blood pressures at home and follow low-sodium diet.  Continue current dose of amlodipine.      Relevant Medications   amLODipine (NORVASC) 10 MG tablet     Respiratory   COPD (chronic obstructive pulmonary disease) (HCC)   Currently using albuterol as needed and has not used the [employer]. Occasional shortness of breath with colder weather otherwise doing well. Discontinue Stiolto and continue with albuterol as needed. Encouraged to establish with Pulmonology for testing.       Relevant Medications   albuterol (PROVENTIL) (2.5 MG/3ML) 0.083% nebulizer solution     Genitourinary   Stage  3a chronic kidney disease [facility])   Mr. Mutch has chronic kidney disease stage IIIa with most recent lab work presented from his employer showing an estimated GFR of 53.  Continue management of chronic conditions and will change ART to Dovato to remove tenofovir.      Relevant Orders   COMPLETE METABOLIC PANEL WITH GFR     Other   Human immunodeficiency virus (HIV) disease (HCC) - Primary   Mr. Ruggieri continues to have well-controlled virus with good adherence and tolerance to Genvoya.  Reviewed previous lab work and discussed plan of care and U equals U.  Reviewed concern for renal function and to optimize risk reduction would recommend change to Dovato.  Following discussion he is in agreement.  Discontinue Genvoya and start Dovato.  Social determinants of health reviewed with no interventions indicated.  Plan for follow-up in 6 months or sooner if needed with lab work on the same day.      Relevant Medications   dolutegravir-lamiVUDine (DOVATO) 50-300 MG tablet   Other Relevant Orders   COMPLETE METABOLIC PANEL WITH GFR   CBC   HIV-1 RNA quant-no reflex-bld   T-helper cells (CD4) count (not at [facility])   Pain in joint, shoulder region   Mr. Luecke has been having acute right-sided shoulder pain that waxes and wanes and refractory to IcyHot type products.  Encouraged to follow-up with orthopedics or sports medicine for further evaluation and treatment as appropriate.  In the interim treat conservatively with ice and home exercise therapy as needed.      Healthcare maintenance   Discussed importance of safe sexual practice and condom use. Condoms and site specific STD testing offered.  Vaccinations reviewed - declined following counseling. Declines colon cancer screening and referral.  Due for dental care which he will schedule independently         I have discontinued Bernadene Bell. Piotrowski's traZODone, Stiolto Respimat, and Genvoya. I am also having him start on Dovato.  Additionally, I am having him maintain his Nebulizer, cyclobenzaprine, amLODipine, and albuterol.   Meds ordered this encounter  Medications   amLODipine (NORVASC) 10 MG tablet    Sig: Take 1 tablet (10 mg total) by mouth daily.    Dispense:  30 tablet    Refill:  4    Supervising Provider:   Judyann Munson [[id number]]   dolutegravir-lamiVUDine (DOVATO) 50-300 MG tablet    Sig: Take  1 tablet by mouth daily.    Dispense:  30 tablet    Refill:  5    Supervising Provider:   Judyann Munson [[id number]]   albuterol (PROVENTIL) (2.5 MG/3ML) 0.083% nebulizer solution    Sig: Take 3 mLs (2.5 mg total) by nebulization every 6 (six) hours as needed for wheezing or shortness of breath.    Dispense:  75 mL    Refill:  2    Supervising Provider:   Judyann Munson [[id number]]     Follow-up: Return in about 6 months (around 06/25/2024). or sooner if needed.    Marcos Eke, MSN, FNP-C Nurse Practitioner [facility] Main number: [phone]

## 2023-12-27 NOTE — Assessment & Plan Note (Signed)
 Noah Perez continues to have well-controlled virus with good adherence and tolerance to Genvoya.  Reviewed previous lab work and discussed plan of care and U equals U.  Reviewed concern for renal function and to optimize risk reduction would recommend change to Dovato.  Following discussion he is in agreement.  Discontinue Genvoya and start Dovato.  Social determinants of health reviewed with no interventions indicated.  Plan for follow-up in 6 months or sooner if needed with lab work on the same day.

## 2023-12-27 NOTE — Assessment & Plan Note (Signed)
 Currently using albuterol as needed and has not used the [employer]. Occasional shortness of breath with colder weather otherwise doing well. Discontinue Stiolto and continue with albuterol as needed. Encouraged to establish with Pulmonology for testing.

## 2023-12-27 NOTE — Assessment & Plan Note (Signed)
 Noah Perez has chronic kidney disease stage IIIa with most recent lab work presented from his employer showing an estimated GFR of 53.  Continue management of chronic conditions and will change ART to Dovato to remove tenofovir.

## 2023-12-27 NOTE — Assessment & Plan Note (Signed)
 Discussed importance of safe sexual practice and condom use. Condoms and site specific STD testing offered.  Vaccinations reviewed - declined following counseling. Declines colon cancer screening and referral.  Due for dental care which he will schedule independently

## 2023-12-27 NOTE — Assessment & Plan Note (Signed)
 Noah Perez has been having acute right-sided shoulder pain that waxes and wanes and refractory to IcyHot type products.  Encouraged to follow-up with orthopedics or sports medicine for further evaluation and treatment as appropriate.  In the interim treat conservatively with ice and home exercise therapy as needed.

## 2023-12-30 ENCOUNTER — Telehealth: Payer: Self-pay

## 2023-12-30 NOTE — Telephone Encounter (Addendum)
 Patient called, states he's having a hard time getting his Genvoya from [facility]. Discussed that provider recently switched him to Dovato.   Spoke with [facility] in [place], [place]. They were out of stock of Dovato, but received it today.   Called Noah Perez back and let him know he should be able to pick it up this afternoon.   Sandie Ano, RN

## 2023-12-31 LAB — CBC
HCT: 49 % (ref 38.5–50.0)
Hemoglobin: 16.3 g/dL (ref 13.2–17.1)
MCH: 30.9 pg (ref 27.0–33.0)
MCHC: 33.3 g/dL (ref 32.0–36.0)
MCV: 92.8 fL (ref 80.0–100.0)
MPV: 10.6 fL (ref 7.5–12.5)
Platelets: 232 10*3/uL (ref 140–400)
RBC: 5.28 10*6/uL (ref 4.20–5.80)
RDW: 12.9 % (ref 11.0–15.0)
WBC: 6.1 10*3/uL (ref 3.8–10.8)

## 2023-12-31 LAB — COMPLETE METABOLIC PANEL WITH GFR
AG Ratio: 2 (calc) (ref 1.0–2.5)
ALT: 12 U/L (ref 9–46)
AST: 15 U/L (ref 10–35)
Albumin: 4.3 g/dL (ref 3.6–5.1)
Alkaline phosphatase (APISO): 90 U/L (ref 35–144)
BUN/Creatinine Ratio: 11 (calc) (ref 6–22)
BUN: 14 mg/dL (ref 7–25)
CO2: 29 mmol/L (ref 20–32)
Calcium: 9.7 mg/dL (ref 8.6–10.3)
Chloride: 104 mmol/L (ref 98–110)
Creat: 1.31 mg/dL — ABNORMAL HIGH (ref 0.70–1.28)
Globulin: 2.1 g/dL (ref 1.9–3.7)
Glucose, Bld: 71 mg/dL (ref 65–99)
Potassium: 4.1 mmol/L (ref 3.5–5.3)
Sodium: 141 mmol/L (ref 135–146)
Total Bilirubin: 0.9 mg/dL (ref 0.2–1.2)
Total Protein: 6.4 g/dL (ref 6.1–8.1)
eGFR: 58 mL/min/{1.73_m2} — ABNORMAL LOW (ref >=60)

## 2023-12-31 LAB — T-HELPER CELLS (CD4) COUNT (NOT AT ARMC)
Absolute CD4: 644 {cells}/uL (ref 490–1740)
CD4 T Helper %: 44 % (ref 30–61)
Total lymphocyte count: 1456 {cells}/uL (ref 850–3900)

## 2023-12-31 LAB — HIV-1 RNA QUANT-NO REFLEX-BLD
HIV 1 RNA Quant: <20 {copies}/mL — ABNORMAL HIGH
HIV-1 RNA Quant, Log: <1.3 {Log_copies}/mL — ABNORMAL HIGH

## 2024-01-02 ENCOUNTER — Other Ambulatory Visit (HOSPITAL_COMMUNITY): Payer: Self-pay

## 2024-03-19 NOTE — Progress Notes (Signed)
 The 10-year ASCVD risk score (Arnett DK, et al., 2019) is: 22.7%   Values used to calculate the score:     Age: 73 years     Sex: Male     Is Non-Hispanic African American: No     Diabetic: No     Tobacco smoker: No     Systolic Blood Pressure: 136 mmHg     Is BP treated: Yes     HDL Cholesterol: 67 mg/dL     Total Cholesterol: 185 mg/dL  No current statin therapy, no upcoming appointment.   Kyuss Hale, BSN, RN

## 2024-05-16 ENCOUNTER — Other Ambulatory Visit: Payer: Self-pay | Admitting: Family

## 2024-05-16 DIAGNOSIS — I10 Essential (primary) hypertension: Secondary | ICD-10-CM

## 2024-05-29 ENCOUNTER — Other Ambulatory Visit: Payer: Self-pay | Admitting: Family

## 2024-06-16 ENCOUNTER — Telehealth: Payer: Self-pay

## 2024-06-16 DIAGNOSIS — B20 Human immunodeficiency virus [HIV] disease: Secondary | ICD-10-CM

## 2024-06-16 MED ORDER — DOVATO 50-300 MG PO TABS: 1.0000 | ORAL_TABLET | Freq: Every day | ORAL | 5 refills | Status: AC

## 2024-06-16 NOTE — Telephone Encounter (Signed)
 Patient called office today requesting refills on Dovato . Refills sent per request.  Reminded him of his upcoming appt. Lorenda CHRISTELLA Code, RMA

## 2024-06-26 ENCOUNTER — Ambulatory Visit: Admitting: Family

## 2024-06-26 ENCOUNTER — Other Ambulatory Visit: Payer: Self-pay

## 2024-06-26 ENCOUNTER — Encounter: Payer: Self-pay | Admitting: Family

## 2024-06-26 VITALS — BP 125/75 | HR 68 | Temp 97.6°F | Ht 71.0 in | Wt 138.0 lb

## 2024-06-26 DIAGNOSIS — Z0000 Encounter for general adult medical examination without abnormal findings: Secondary | ICD-10-CM

## 2024-06-26 DIAGNOSIS — I129 Hypertensive chronic kidney disease with stage 1 through stage 4 chronic kidney disease, or unspecified chronic kidney disease: Secondary | ICD-10-CM

## 2024-06-26 DIAGNOSIS — B20 Human immunodeficiency virus [HIV] disease: Secondary | ICD-10-CM | POA: Diagnosis not present

## 2024-06-26 DIAGNOSIS — Z79899 Other long term (current) drug therapy: Secondary | ICD-10-CM

## 2024-06-26 DIAGNOSIS — I10 Essential (primary) hypertension: Secondary | ICD-10-CM

## 2024-06-26 DIAGNOSIS — N1831 Chronic kidney disease, stage 3a: Secondary | ICD-10-CM | POA: Diagnosis not present

## 2024-06-26 MED ORDER — AMLODIPINE BESYLATE 10 MG PO TABS: 10.0000 mg | ORAL_TABLET | Freq: Every day | ORAL | 1 refills | Status: AC

## 2024-06-26 NOTE — Progress Notes (Signed)
 Brief Narrative   Patient ID: CASS VANDERMEULEN, male    DOB: [birth year], 73 y.o.   MRN: [id number]  Noah Perez is a 73 y/o caucasian male diagnosed with HIV disease in 22. Initial viral load and CD4 count are unavailable. No history of opportunistic infection. Earliest genotype with no significant resistant patterns. ART medication includes Atripla, Stribild and now Genvoya .    Subjective:   Chief Complaint  Patient presents with   Follow-up    B20    HPI:  Noah Perez is a 73 y.o. male with HIV disease last seen on 12/27/2023 with well-controlled virus and good adherence and tolerance to Genvoya .  Viral load was undetectable with CD4 count 644.  Renal function remains consistent with CKD stage IIIa.  Liver function electrolytes within normal ranges.  Following discussion was changed to Dovato .  Here today for routine follow-up.  Noah Perez has been doing well since his last office visit and continues to take Dovato  as prescribed with no adverse side effects.  No problems obtaining medication from the pharmacy recently being told that it is on backorder.  New prescription recently sent in.  No new concerns/complaints.  He is excited about his sugar gliders for his new hobby.  Healthcare maintenance reviewed.  Condoms and site-specific STD testing offered.  Denies fevers, chills, night sweats, headaches, changes in vision, neck pain/stiffness, nausea, diarrhea, vomiting, lesions or rashes.  Lab Results  Component Value Date   CD4TCELL 44 12/27/2023   CD4TABS 567 11/13/2021   Lab Results  Component Value Date   HIV1RNAQUANT <20 (H) 12/27/2023     Allergies  Allergen Reactions   Oxycodone-Acetaminophen     REACTION: nausea, vomiting      Outpatient Medications Prior to Visit  Medication Sig Dispense Refill   dolutegravir-lamiVUDine (DOVATO ) 50-300 MG tablet Take 1 tablet by mouth daily. 30 tablet 5   Nebulizer MISC Nebulizer machine and age-appropriate  nebulizer accessories (Mask, Adapter, Tubing, Filter, Mesh Cup & Medication Cup) and other supplies as needed for administration of inhaled pulmonary medication. May substitute for comparable unit per insurance formulary 1 each 0   albuterol  (PROVENTIL ) (2.5 MG/3ML) 0.083% nebulizer solution Take 3 mLs (2.5 mg total) by nebulization every 6 (six) hours as needed for wheezing or shortness of breath. 75 mL 2   amLODipine  (NORVASC ) 10 MG tablet TAKE 1 TABLET(10 MG) BY MOUTH DAILY 90 tablet 0   cyclobenzaprine  (FLEXERIL ) 5 MG tablet Take 1 tablet (5 mg total) by mouth 3 (three) times daily as needed for muscle spasms. 30 tablet 0   No facility-administered medications prior to visit.     Past Medical History:  Diagnosis Date   HIV (human immunodeficiency virus infection) (HCC)    Hypertension      History reviewed. No pertinent surgical history.      Review of Systems  Constitutional:  Negative for appetite change, chills, fatigue, fever and unexpected weight change.  Eyes:  Negative for visual disturbance.  Respiratory:  Negative for cough, chest tightness, shortness of breath and wheezing.   Cardiovascular:  Negative for chest pain and leg swelling.  Gastrointestinal:  Negative for abdominal pain, constipation, diarrhea, nausea and vomiting.  Genitourinary:  Negative for dysuria, flank pain, frequency, genital sores, hematuria and urgency.  Skin:  Negative for rash.  Allergic/Immunologic: Negative for immunocompromised state.  Neurological:  Negative for dizziness and headaches.     Objective:   BP 125/75   Pulse 68   Temp 97.6 F (36.4  C) (Oral)   Ht 5' 11 (1.803 m)   Wt 138 lb (62.6 kg)   SpO2 98%   BMI 19.25 kg/m  Nursing note and vital signs reviewed.  Physical Exam Constitutional:      General: He is not in acute distress.    Appearance: He is well-developed.  Eyes:     Conjunctiva/sclera: Conjunctivae normal.  Cardiovascular:     Rate and Rhythm: Normal  rate and regular rhythm.     Heart sounds: Normal heart sounds. No murmur heard.    No friction rub. No gallop.  Pulmonary:     Effort: Pulmonary effort is normal. No respiratory distress.     Breath sounds: Normal breath sounds. No wheezing or rales.  Chest:     Chest wall: No tenderness.  Abdominal:     General: Bowel sounds are normal.     Palpations: Abdomen is soft.     Tenderness: There is no abdominal tenderness.  Musculoskeletal:     Cervical back: Neck supple.  Lymphadenopathy:     Cervical: No cervical adenopathy.  Skin:    General: Skin is warm and dry.     Findings: No rash.  Neurological:     Mental Status: He is alert and oriented to person, place, and time.  Psychiatric:        Behavior: Behavior normal.        Thought Content: Thought content normal.        Judgment: Judgment normal.          06/26/2024   10:53 AM 12/27/2023   11:06 AM 12/01/2021   11:03 AM 01/27/2021   11:40 AM 01/04/2020    9:16 AM  Depression screen PHQ 2/9  Decreased Interest 0 0 0 0 0  Down, Depressed, Hopeless 0 0 0 0 0  PHQ - 2 Score 0 0 0 0 0  Altered sleeping 0      Tired, decreased energy 0      Change in appetite 0      Feeling bad or failure about yourself  0      Trouble concentrating 0      Moving slowly or fidgety/restless 0      Suicidal thoughts 0      PHQ-9 Score 0      Difficult doing work/chores Not difficult at all            06/26/2024   10:53 AM  GAD 7 : Generalized Anxiety Score  Nervous, Anxious, on Edge 0  Control/stop worrying 0  Worry too much - different things 0  Trouble relaxing 0  Restless 0  Easily annoyed or irritable 0  Afraid - awful might happen 0  Total GAD 7 Score 0  Anxiety Difficulty Not difficult at all     The 10-year ASCVD risk score (Arnett DK, et al., 2019) is: 19.8%   Values used to calculate the score:     Age: 1 years     Clincally relevant sex: Male     Is Non-Hispanic African American: No     Diabetic: No     Tobacco  smoker: No     Systolic Blood Pressure: 125 mmHg     Is BP treated: Yes     HDL Cholesterol: 67 mg/dL     Total Cholesterol: 185 mg/dL      Assessment & Plan:    Patient Active Problem List   Diagnosis Date Noted   Neck muscle spasm 01/11/2023  Stage 3a chronic kidney disease (HCC) 01/11/2023   Healthcare maintenance 10/31/2018   Pain in joint, shoulder region 02/06/2016   Arthritis of foot, 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   Onychomycosis of toenail 11/16/2013   COPD (chronic obstructive pulmonary disease) (HCC) 11/16/2013   MUSCLE WEAKNESS (GENERALIZED) 12/29/2010   INSOMNIA 12/29/2010   PERS HX NONCOMPLIANCE W/MED TX PRS HAZARDS HLTH 12/29/2010   TINNITUS, CHRONIC, LEFT 07/08/2009   Human immunodeficiency virus (HIV) disease (HCC) 06/03/2009   CIGARETTE SMOKER 06/03/2009   Essential hypertension 06/03/2009   DENTAL CARIES 06/03/2009   DEGENERATIVE DISC DISEASE 06/03/2009   PNEUMONIA, HX OF 06/03/2009   SHINGLES, HX OF 06/03/2009   INGUINAL HERNIORRHAPHY, LEFT, HX OF 06/03/2009     Problem List Items Addressed This Visit       Cardiovascular and Mediastinum   Essential hypertension   Calab's blood pressure is well-controlled with current dose of amlodipine  and no adverse side effects.  Ophthalmologic/neurologic signs/symptoms.  Encouraged to monitor blood pressure at home and follow low-sodium/DASH diet.  Continue current dose of amlodipine .      Relevant Medications   amLODipine  (NORVASC ) 10 MG tablet     Genitourinary   Stage 3a chronic kidney disease (HCC)   Renal function remained stable and consistent with chronic kidney disease stage IIIa.  Blood pressure well-controlled.  Now on Dovato  for ART.  Continue to monitor renal function and check lab work today.        Other   Human immunodeficiency virus (HIV) disease (HCC) - Primary   Mikyle continues to have well controlled virus with good adherence and tolerance to Dovato .  Reviewed previous lab work and discussed plan of  care and U equals U.  Social determinants of health reviewed with no interventions indicated.  Contacted pharmacy to determine status of prescription and is available for pickup.  If he continues to have issues picking at medication may need to consider an alternative pharmacy.  Check lab work.  Continue current dose of Dovato .  Plan for follow-up in 6 months or sooner if needed with lab work on the same day.      Relevant Orders   Comprehensive metabolic panel with GFR   HIV-1 RNA quant-no reflex-bld   T-helper cells (CD4) count (not at [facility])   Healthcare maintenance   Discussed importance of safe sexual practice and condom use. Condoms and site specific STD testing offered.  Vaccinations reviewed and declined following counseling.  Due for colon cancer screening and declines Cologuard or referral.  Due for routine dental care which he will schedule independently.       Other Visit Diagnoses       Pharmacologic therapy       Relevant Orders   Lipid panel        I have discontinued Ugo K. Storey's cyclobenzaprine  and albuterol . I have also changed his amLODipine . Additionally, I am having him maintain his Nebulizer and Dovato .   Meds ordered this encounter  Medications   amLODipine  (NORVASC ) 10 MG tablet    Sig: Take 1 tablet (10 mg total) by mouth daily.    Dispense:  90 tablet    Refill:  1    Supervising Provider:   LUIZ CHANNEL [[id number]]     Follow-up: Return in about 6 months (around 12/27/2024). or sooner if needed.    Cathlyn July, MSN, FNP-C Nurse Practitioner [place] for Infectious Disease [facility] Main number: [phone]

## 2024-06-26 NOTE — Assessment & Plan Note (Addendum)
 Renal function remained stable and consistent with chronic kidney disease stage IIIa.  Blood pressure well-controlled.  Now on Dovato  for ART.  Continue to monitor renal function and check lab work today.

## 2024-06-26 NOTE — Assessment & Plan Note (Signed)
 Noah Perez continues to have well controlled virus with good adherence and tolerance to Dovato .  Reviewed previous lab work and discussed plan of care and U equals U.  Social determinants of health reviewed with no interventions indicated.  Contacted pharmacy to determine status of prescription and is available for pickup.  If he continues to have issues picking at medication may need to consider an alternative pharmacy.  Check lab work.  Continue current dose of Dovato .  Plan for follow-up in 6 months or sooner if needed with lab work on the same day.

## 2024-06-26 NOTE — Assessment & Plan Note (Signed)
 Noah Perez's blood pressure is well-controlled with current dose of amlodipine  and no adverse side effects.  Ophthalmologic/neurologic signs/symptoms.  Encouraged to monitor blood pressure at home and follow low-sodium/DASH diet.  Continue current dose of amlodipine .

## 2024-06-26 NOTE — Patient Instructions (Signed)
 Nice to see you. ? ?We will check your lab work today. ? ?Continue to take your medication daily as prescribed. ? ?Refills have been sent to the pharmacy. ? ?Plan for follow up in 6 months or sooner if needed with lab work on the same day. ? ?Have a great day and stay safe! ? ?

## 2024-06-26 NOTE — Assessment & Plan Note (Signed)
 Discussed importance of safe sexual practice and condom use. Condoms and site specific STD testing offered.  Vaccinations reviewed and declined following counseling.  Due for colon cancer screening and declines Cologuard or referral.  Due for routine dental care which he will schedule independently.

## 2024-06-30 LAB — COMPREHENSIVE METABOLIC PANEL WITH GFR
AG Ratio: 2 (calc) (ref 1.0–2.5)
ALT: 15 U/L (ref 9–46)
AST: 16 U/L (ref 10–35)
Albumin: 4.5 g/dL (ref 3.6–5.1)
Alkaline phosphatase (APISO): 91 U/L (ref 35–144)
BUN: 11 mg/dL (ref 7–25)
CO2: 30 mmol/L (ref 20–32)
Calcium: 9.5 mg/dL (ref 8.6–10.3)
Chloride: 102 mmol/L (ref 98–110)
Creat: 1.18 mg/dL (ref 0.70–1.28)
Globulin: 2.2 g/dL (ref 1.9–3.7)
Glucose, Bld: 52 mg/dL — ABNORMAL LOW (ref 65–99)
Potassium: 4.1 mmol/L (ref 3.5–5.3)
Sodium: 140 mmol/L (ref 135–146)
Total Bilirubin: 0.8 mg/dL (ref 0.2–1.2)
Total Protein: 6.7 g/dL (ref 6.1–8.1)
eGFR: 66 mL/min/1.73m2 (ref >=60)

## 2024-06-30 LAB — T-HELPER CELLS (CD4) COUNT (NOT AT ARMC)
Absolute CD4: 660 {cells}/uL (ref 490–1740)
CD4 T Helper %: 49 % (ref 30–61)
Total lymphocyte count: 1355 {cells}/uL (ref 850–3900)

## 2024-06-30 LAB — LIPID PANEL
Cholesterol: 166 mg/dL (ref <200)
HDL: 58 mg/dL (ref >=40)
LDL Cholesterol (Calc): 87 mg/dL
Non-HDL Cholesterol (Calc): 108 mg/dL (ref <130)
Total CHOL/HDL Ratio: 2.9 (calc) (ref <5.0)
Triglycerides: 109 mg/dL (ref <150)

## 2024-06-30 LAB — HIV-1 RNA QUANT-NO REFLEX-BLD
HIV 1 RNA Quant: NOT DETECTED {copies}/mL
HIV-1 RNA Quant, Log: NOT DETECTED {Log_copies}/mL

## 2024-07-02 ENCOUNTER — Other Ambulatory Visit: Payer: Self-pay | Admitting: Pharmacist

## 2024-07-02 DIAGNOSIS — B20 Human immunodeficiency virus [HIV] disease: Secondary | ICD-10-CM

## 2024-07-02 MED ORDER — DOVATO 50-300 MG PO TABS: 1.0000 | ORAL_TABLET | Freq: Every day | ORAL | Status: AC

## 2024-07-02 NOTE — Progress Notes (Signed)
 Medication Samples have been provided to the patient.  Drug name: Dovato         Strength: 50/300 mg         Qty: 14  Tablets (1 bottles) LOT: 667G   Exp.Date: 9/26  Samples requested by Cathlyn July, NP.  Dosing instructions: Take one tablet by mouth once daily  The patient has been instructed regarding the correct time, dose, and frequency of taking this medication, including desired effects and most common side effects.   Alan Geralds, PharmD, CPP, BCIDP, AAHIVP Clinical Pharmacist Practitioner Infectious Diseases Clinical Pharmacist [facility]

## 2024-07-03 ENCOUNTER — Ambulatory Visit: Payer: Self-pay | Admitting: Family

## 2024-09-17 ENCOUNTER — Telehealth: Payer: Self-pay

## 2024-09-17 NOTE — Telephone Encounter (Signed)
 Patient requesting something for sleep. Patient reports no pcp.  Noah Perez, CMA
